# Patient Record
Sex: Female | Born: 1960 | Race: White | Hispanic: No | Smoking: Current every day smoker
Health system: Southern US, Community
[De-identification: ages and names within clinical notes are randomized; demographics above are authoritative.]

## PROBLEM LIST (undated history)

## (undated) DIAGNOSIS — E2740 Unspecified adrenocortical insufficiency: Secondary | ICD-10-CM

## (undated) DIAGNOSIS — F4000 Agoraphobia, unspecified: Secondary | ICD-10-CM

## (undated) DIAGNOSIS — Z720 Tobacco use: Secondary | ICD-10-CM

## (undated) DIAGNOSIS — F32A Depression, unspecified: Secondary | ICD-10-CM

## (undated) DIAGNOSIS — F329 Major depressive disorder, single episode, unspecified: Secondary | ICD-10-CM

## (undated) DIAGNOSIS — G4733 Obstructive sleep apnea (adult) (pediatric): Secondary | ICD-10-CM

## (undated) DIAGNOSIS — G8929 Other chronic pain: Secondary | ICD-10-CM

## (undated) HISTORY — DX: Other chronic pain: G89.29

---

## 2018-01-08 DIAGNOSIS — I517 Cardiomegaly: Secondary | ICD-10-CM | POA: Diagnosis not present

## 2018-01-08 DIAGNOSIS — I509 Heart failure, unspecified: Secondary | ICD-10-CM | POA: Diagnosis not present

## 2018-01-08 DIAGNOSIS — E8770 Fluid overload, unspecified: Secondary | ICD-10-CM | POA: Insufficient documentation

## 2018-01-08 DIAGNOSIS — R531 Weakness: Secondary | ICD-10-CM | POA: Diagnosis not present

## 2018-01-08 DIAGNOSIS — R0602 Shortness of breath: Secondary | ICD-10-CM | POA: Diagnosis not present

## 2018-01-09 ENCOUNTER — Inpatient Hospital Stay (HOSPITAL_COMMUNITY): Payer: Medicaid Other

## 2018-01-09 ENCOUNTER — Inpatient Hospital Stay (HOSPITAL_COMMUNITY)
Admission: AD | Admit: 2018-01-09 | Discharge: 2018-01-17 | DRG: 208 | Disposition: A | Discharge status: Home Health | Payer: Medicaid Other | Source: Other Acute Inpatient Hospital | Attending: Family Medicine | Admitting: Family Medicine

## 2018-01-09 ENCOUNTER — Encounter (HOSPITAL_COMMUNITY): Payer: Self-pay | Admitting: Critical Care Medicine

## 2018-01-09 DIAGNOSIS — J811 Chronic pulmonary edema: Secondary | ICD-10-CM | POA: Diagnosis not present

## 2018-01-09 DIAGNOSIS — K761 Chronic passive congestion of liver: Secondary | ICD-10-CM | POA: Diagnosis present

## 2018-01-09 DIAGNOSIS — F4000 Agoraphobia, unspecified: Secondary | ICD-10-CM | POA: Diagnosis present

## 2018-01-09 DIAGNOSIS — F329 Major depressive disorder, single episode, unspecified: Secondary | ICD-10-CM | POA: Diagnosis present

## 2018-01-09 DIAGNOSIS — J449 Chronic obstructive pulmonary disease, unspecified: Secondary | ICD-10-CM | POA: Diagnosis present

## 2018-01-09 DIAGNOSIS — R0603 Acute respiratory distress: Secondary | ICD-10-CM | POA: Diagnosis not present

## 2018-01-09 DIAGNOSIS — Z6841 Body Mass Index (BMI) 40.0 and over, adult: Secondary | ICD-10-CM | POA: Diagnosis not present

## 2018-01-09 DIAGNOSIS — K7460 Unspecified cirrhosis of liver: Secondary | ICD-10-CM

## 2018-01-09 DIAGNOSIS — R4182 Altered mental status, unspecified: Secondary | ICD-10-CM | POA: Diagnosis not present

## 2018-01-09 DIAGNOSIS — E662 Morbid (severe) obesity with alveolar hypoventilation: Secondary | ICD-10-CM | POA: Diagnosis present

## 2018-01-09 DIAGNOSIS — J810 Acute pulmonary edema: Secondary | ICD-10-CM

## 2018-01-09 DIAGNOSIS — D751 Secondary polycythemia: Secondary | ICD-10-CM | POA: Diagnosis present

## 2018-01-09 DIAGNOSIS — J90 Pleural effusion, not elsewhere classified: Secondary | ICD-10-CM | POA: Diagnosis not present

## 2018-01-09 DIAGNOSIS — J9811 Atelectasis: Secondary | ICD-10-CM | POA: Diagnosis present

## 2018-01-09 DIAGNOSIS — J9602 Acute respiratory failure with hypercapnia: Secondary | ICD-10-CM

## 2018-01-09 DIAGNOSIS — E871 Hypo-osmolality and hyponatremia: Secondary | ICD-10-CM | POA: Diagnosis not present

## 2018-01-09 DIAGNOSIS — G4733 Obstructive sleep apnea (adult) (pediatric): Secondary | ICD-10-CM | POA: Diagnosis not present

## 2018-01-09 DIAGNOSIS — D6959 Other secondary thrombocytopenia: Secondary | ICD-10-CM | POA: Diagnosis present

## 2018-01-09 DIAGNOSIS — I5031 Acute diastolic (congestive) heart failure: Secondary | ICD-10-CM | POA: Diagnosis present

## 2018-01-09 DIAGNOSIS — E876 Hypokalemia: Secondary | ICD-10-CM

## 2018-01-09 DIAGNOSIS — R918 Other nonspecific abnormal finding of lung field: Secondary | ICD-10-CM | POA: Diagnosis not present

## 2018-01-09 DIAGNOSIS — G9341 Metabolic encephalopathy: Secondary | ICD-10-CM | POA: Diagnosis present

## 2018-01-09 DIAGNOSIS — E2740 Unspecified adrenocortical insufficiency: Secondary | ICD-10-CM | POA: Diagnosis present

## 2018-01-09 DIAGNOSIS — J9621 Acute and chronic respiratory failure with hypoxia: Secondary | ICD-10-CM | POA: Diagnosis present

## 2018-01-09 DIAGNOSIS — K760 Fatty (change of) liver, not elsewhere classified: Secondary | ICD-10-CM | POA: Diagnosis not present

## 2018-01-09 DIAGNOSIS — J9601 Acute respiratory failure with hypoxia: Secondary | ICD-10-CM | POA: Diagnosis not present

## 2018-01-09 DIAGNOSIS — D72829 Elevated white blood cell count, unspecified: Secondary | ICD-10-CM | POA: Diagnosis present

## 2018-01-09 DIAGNOSIS — Z978 Presence of other specified devices: Secondary | ICD-10-CM | POA: Diagnosis not present

## 2018-01-09 DIAGNOSIS — J9622 Acute and chronic respiratory failure with hypercapnia: Secondary | ICD-10-CM | POA: Diagnosis present

## 2018-01-09 DIAGNOSIS — Z4659 Encounter for fitting and adjustment of other gastrointestinal appliance and device: Secondary | ICD-10-CM | POA: Diagnosis not present

## 2018-01-09 DIAGNOSIS — D696 Thrombocytopenia, unspecified: Secondary | ICD-10-CM | POA: Diagnosis not present

## 2018-01-09 DIAGNOSIS — R0902 Hypoxemia: Secondary | ICD-10-CM | POA: Diagnosis not present

## 2018-01-09 DIAGNOSIS — J9612 Chronic respiratory failure with hypercapnia: Secondary | ICD-10-CM | POA: Diagnosis not present

## 2018-01-09 DIAGNOSIS — G9340 Encephalopathy, unspecified: Secondary | ICD-10-CM | POA: Insufficient documentation

## 2018-01-09 DIAGNOSIS — F17210 Nicotine dependence, cigarettes, uncomplicated: Secondary | ICD-10-CM | POA: Diagnosis present

## 2018-01-09 DIAGNOSIS — Z4682 Encounter for fitting and adjustment of non-vascular catheter: Secondary | ICD-10-CM | POA: Diagnosis not present

## 2018-01-09 HISTORY — DX: Morbid (severe) obesity due to excess calories: E66.01

## 2018-01-09 HISTORY — DX: Unspecified adrenocortical insufficiency: E27.40

## 2018-01-09 HISTORY — DX: Tobacco use: Z72.0

## 2018-01-09 HISTORY — DX: Obstructive sleep apnea (adult) (pediatric): G47.33

## 2018-01-09 HISTORY — DX: Agoraphobia, unspecified: F40.00

## 2018-01-09 HISTORY — DX: Depression, unspecified: F32.A

## 2018-01-09 HISTORY — DX: Major depressive disorder, single episode, unspecified: F32.9

## 2018-01-09 LAB — CBC WITH DIFFERENTIAL/PLATELET
Basophils Absolute: 0 10*3/uL (ref 0.0–0.1)
Basophils Relative: 0 %
EOS PCT: 0 %
Eosinophils Absolute: 0 10*3/uL (ref 0.0–0.7)
HCT: 46.4 % — ABNORMAL HIGH (ref 36.0–46.0)
HEMOGLOBIN: 15.5 g/dL — AB (ref 12.0–15.0)
LYMPHS ABS: 1.5 10*3/uL (ref 0.7–4.0)
Lymphocytes Relative: 12 %
MCH: 28.8 pg (ref 26.0–34.0)
MCHC: 33.4 g/dL (ref 30.0–36.0)
MCV: 86.1 fL (ref 78.0–100.0)
MONO ABS: 0.8 10*3/uL (ref 0.1–1.0)
MONOS PCT: 6 %
NEUTROS PCT: 82 %
Neutro Abs: 10.5 10*3/uL — ABNORMAL HIGH (ref 1.7–7.7)
Platelets: 145 10*3/uL — ABNORMAL LOW (ref 150–400)
RBC: 5.39 MIL/uL — ABNORMAL HIGH (ref 3.87–5.11)
RDW: 15.2 % (ref 11.5–15.5)
WBC: 12.8 10*3/uL — ABNORMAL HIGH (ref 4.0–10.5)

## 2018-01-09 LAB — COMPREHENSIVE METABOLIC PANEL
ALT: 20 U/L (ref 14–54)
ANION GAP: 10 (ref 5–15)
AST: 27 U/L (ref 15–41)
Albumin: 3.3 g/dL — ABNORMAL LOW (ref 3.5–5.0)
Alkaline Phosphatase: 60 U/L (ref 38–126)
BILIRUBIN TOTAL: 1.7 mg/dL — AB (ref 0.3–1.2)
BUN: <5 mg/dL — ABNORMAL LOW (ref 6–20)
CHLORIDE: 79 mmol/L — AB (ref 101–111)
CO2: 34 mmol/L — ABNORMAL HIGH (ref 22–32)
Calcium: 7.6 mg/dL — ABNORMAL LOW (ref 8.9–10.3)
Creatinine, Ser: 0.58 mg/dL (ref 0.44–1.00)
GFR calc Af Amer: >60 mL/min (ref >60)
Glucose, Bld: 104 mg/dL — ABNORMAL HIGH (ref 65–99)
POTASSIUM: 2.9 mmol/L — AB (ref 3.5–5.1)
Sodium: 123 mmol/L — ABNORMAL LOW (ref 135–145)
TOTAL PROTEIN: 5.6 g/dL — AB (ref 6.5–8.1)

## 2018-01-09 LAB — TYPE AND SCREEN
ABO/RH(D): B POS
Antibody Screen: NEGATIVE

## 2018-01-09 LAB — PHOSPHORUS: PHOSPHORUS: 3.9 mg/dL (ref 2.5–4.6)

## 2018-01-09 LAB — PROTIME-INR
INR: 1.29
Prothrombin Time: 15.9 seconds — ABNORMAL HIGH (ref 11.4–15.2)

## 2018-01-09 LAB — MRSA PCR SCREENING: MRSA by PCR: NEGATIVE

## 2018-01-09 LAB — MAGNESIUM: MAGNESIUM: 2.1 mg/dL (ref 1.7–2.4)

## 2018-01-09 LAB — BRAIN NATRIURETIC PEPTIDE: B Natriuretic Peptide: 100.7 pg/mL — ABNORMAL HIGH (ref 0.0–100.0)

## 2018-01-09 LAB — LACTIC ACID, PLASMA: LACTIC ACID, VENOUS: 0.8 mmol/L (ref 0.5–1.9)

## 2018-01-09 LAB — ABO/RH: ABO/RH(D): B POS

## 2018-01-09 LAB — GLUCOSE, CAPILLARY
GLUCOSE-CAPILLARY: 113 mg/dL — AB (ref 65–99)
Glucose-Capillary: 145 mg/dL — ABNORMAL HIGH (ref 65–99)

## 2018-01-09 MED ORDER — LORAZEPAM 2 MG/ML IJ SOLN
5.00 | INTRAMUSCULAR | Status: DC
Start: ? — End: 2018-01-09

## 2018-01-09 MED ORDER — ENOXAPARIN SODIUM 40 MG/0.4ML ~~LOC~~ SOLN
40.0000 mg | SUBCUTANEOUS | Status: DC
  Administered 2018-01-09 – 2018-01-10 (×2): 40 mg via SUBCUTANEOUS
  Filled 2018-01-09 (×2): qty 0.4

## 2018-01-09 MED ORDER — ASPIRIN 81 MG PO CHEW: 324.0000 mg | CHEWABLE_TABLET | ORAL | Status: DC

## 2018-01-09 MED ORDER — FENTANYL CITRATE (PF) 100 MCG/2ML IJ SOLN
50.0000 ug | Freq: Once | INTRAMUSCULAR | Status: AC
  Administered 2018-01-09: 50 ug via INTRAVENOUS

## 2018-01-09 MED ORDER — GENERIC EXTERNAL MEDICATION
Status: DC
Start: ? — End: 2018-01-09

## 2018-01-09 MED ORDER — ALBUTEROL SULFATE (2.5 MG/3ML) 0.083% IN NEBU
2.50 | INHALATION_SOLUTION | RESPIRATORY_TRACT | Status: DC
Start: ? — End: 2018-01-09

## 2018-01-09 MED ORDER — PROPOFOL 1000 MG/100ML IV EMUL
INTRAVENOUS | Status: AC
  Administered 2018-01-09: 21:00:00
  Filled 2018-01-09: qty 100

## 2018-01-09 MED ORDER — POTASSIUM CHLORIDE 20 MEQ/15ML (10%) PO SOLN
ORAL | Status: DC
Start: ? — End: 2018-01-09

## 2018-01-09 MED ORDER — LABETALOL HCL 5 MG/ML IV SOLN
5.00 | INTRAVENOUS | Status: DC
Start: ? — End: 2018-01-09

## 2018-01-09 MED ORDER — DEXTROSE 5 % IV SOLN
INTRAVENOUS | Status: DC
Start: ? — End: 2018-01-09

## 2018-01-09 MED ORDER — ORAL CARE MOUTH RINSE
15.0000 mL | OROMUCOSAL | Status: DC
  Administered 2018-01-10 (×6): 15 mL via OROMUCOSAL

## 2018-01-09 MED ORDER — MIDAZOLAM HCL 2 MG/2ML IJ SOLN: 2.0000 mg | INTRAMUSCULAR | Status: DC | PRN

## 2018-01-09 MED ORDER — BENZONATATE 100 MG PO CAPS
100.00 | ORAL_CAPSULE | ORAL | Status: DC
Start: ? — End: 2018-01-09

## 2018-01-09 MED ORDER — SODIUM CHLORIDE 0.9 % IV SOLN
INTRAVENOUS | Status: DC
Start: ? — End: 2018-01-09

## 2018-01-09 MED ORDER — NICOTINE 14 MG/24HR TD PT24
MEDICATED_PATCH | TRANSDERMAL | Status: DC
Start: 2018-01-10 — End: 2018-01-09

## 2018-01-09 MED ORDER — LEVOFLOXACIN IN D5W 750 MG/150ML IV SOLN
750.00 | INTRAVENOUS | Status: DC
Start: 2018-01-10 — End: 2018-01-09

## 2018-01-09 MED ORDER — DOCUSATE SODIUM 50 MG/5ML PO LIQD: 100.0000 mg | Freq: Two times a day (BID) | ORAL | Status: DC | PRN

## 2018-01-09 MED ORDER — NITROGLYCERIN 0.4 MG SL SUBL
0.40 | SUBLINGUAL_TABLET | SUBLINGUAL | Status: DC
Start: ? — End: 2018-01-09

## 2018-01-09 MED ORDER — PANTOPRAZOLE SODIUM 40 MG IV SOLR
40.0000 mg | Freq: Every day | INTRAVENOUS | Status: DC
  Administered 2018-01-09: 40 mg via INTRAVENOUS

## 2018-01-09 MED ORDER — FENTANYL BOLUS VIA INFUSION
50.0000 ug | INTRAVENOUS | Status: DC | PRN
  Filled 2018-01-09: qty 50

## 2018-01-09 MED ORDER — POTASSIUM CHLORIDE CRYS ER 20 MEQ PO TBCR
EXTENDED_RELEASE_TABLET | ORAL | Status: DC
Start: ? — End: 2018-01-09

## 2018-01-09 MED ORDER — FENTANYL 2500MCG IN NS 250ML (10MCG/ML) PREMIX INFUSION
25.0000 ug/h | INTRAVENOUS | Status: DC
  Administered 2018-01-09: 50 ug/h via INTRAVENOUS
  Filled 2018-01-09: qty 250

## 2018-01-09 MED ORDER — ASPIRIN 300 MG RE SUPP: 300.0000 mg | RECTAL | Status: DC

## 2018-01-09 MED ORDER — CHLORHEXIDINE GLUCONATE 0.12% ORAL RINSE (MEDLINE KIT)
15.0000 mL | Freq: Two times a day (BID) | OROMUCOSAL | Status: DC
  Administered 2018-01-09 – 2018-01-10 (×2): 15 mL via OROMUCOSAL

## 2018-01-09 MED ORDER — PROPOFOL 200 MG/20ML IV EMUL
INTRAVENOUS | Status: DC
Start: ? — End: 2018-01-09

## 2018-01-09 MED ORDER — ANTACID & ANTIGAS 200-200-20 MG/5ML PO SUSP
30.00 | ORAL | Status: DC
Start: ? — End: 2018-01-09

## 2018-01-09 MED ORDER — SODIUM CHLORIDE 0.9 % IV SOLN
250.0000 mL | INTRAVENOUS | Status: DC | PRN
  Administered 2018-01-09: 250 mL via INTRAVENOUS

## 2018-01-09 MED ORDER — POTASSIUM CHLORIDE 20 MEQ/50ML IV SOLN
INTRAVENOUS | Status: DC
Start: ? — End: 2018-01-09

## 2018-01-09 MED ORDER — ENOXAPARIN SODIUM 40 MG/0.4ML ~~LOC~~ SOLN
40.00 | SUBCUTANEOUS | Status: DC
Start: 2018-01-10 — End: 2018-01-09

## 2018-01-09 MED ORDER — POLYETHYLENE GLYCOL 3350 17 G PO PACK
17.00 | PACK | ORAL | Status: DC
Start: ? — End: 2018-01-09

## 2018-01-09 NOTE — Progress Notes (Signed)
eLink Physician-Brief Progress Note Patient Name: Stephanie Baldwin DOB: [birth year] MRN: [id number]   Date of Service  01/09/2018  HPI/Events of Note  57 yo admitted for severe resp failure in setting of severe hyponatremia 105-->124 corrected in less than 24 hrs is what I am told PCCM to evaluate, patient arrived from [place]  eICU Interventions  1.admit to ICU 2.repeat labs 3.vent orders/support      Intervention Category Evaluation Type: New Patient Evaluation  Erin FullingKurian Brynlei Klausner 01/09/2018, 8:56 PM

## 2018-01-09 NOTE — H&P (Signed)
PULMONARY / CRITICAL CARE MEDICINE   Name: Stephanie Baldwin MRN: [id number] DOB: January 05, 1961    ADMISSION DATE:  01/09/2018 CONSULTATION DATE:  01/09/18  REFERRING MD:  Mohsin Arshad  CHIEF COMPLAINT:  Respiratory failure and severe hyponatremia  HISTORY OF PRESENT ILLNESS:   Stephanie Baldwin is a 57 y/o woman who presented to [facility] with lethargy, weakness, confusion, and shortness of breath on 4/9 where she was found to be edematous and hyponatremic to 105. Prior to this she was noted to have nausea, vomiting, and very little PO intake for 2 weeks leading up to this admission. She was placed on Bipap for respiratory distress and also started on hypertonic saline for symptomatic hyponatremia. Over the next day sodium corrected rapidly to 124 but she developed worsened mental status and hypercapnea and hypoxia on ABG so was intubated. Due to critical condition and patient's family concerns about the level of care available she was transferred to [facility] ICU as a tertiary care site. She arrived in a stable condition on the ventilator with her mother, sister, and niece present at bedside.  PAST MEDICAL HISTORY :  She  has a past medical history of Agoraphobia, Depression, and Tobacco abuse.  PAST SURGICAL HISTORY: C Section  Allergies  Allergen Reactions  . Aspirin   . Guaifenesin & Derivatives   . Paxil [Paroxetine]   . Penicillins   . Sulfa Antibiotics     No current facility-administered medications on file prior to encounter.    No current outpatient medications on file prior to encounter.    FAMILY HISTORY:  Her has no family status information on file.    SOCIAL HISTORY: Current smoker  REVIEW OF SYSTEMS:   Review of Systems  Constitutional: Negative for fever.  Eyes: Negative for blurred vision.  Respiratory: Positive for shortness of breath.   Cardiovascular: Positive for leg swelling. Negative for chest pain.  Gastrointestinal: Positive for  nausea and vomiting.  Genitourinary: Negative for dysuria.  Musculoskeletal: Positive for back pain.  Skin: Negative for rash.  Neurological: Positive for weakness.  Endo/Heme/Allergies: Negative for polydipsia.  Psychiatric/Behavioral: Positive for depression.     SUBJECTIVE:    VITAL SIGNS: BP 113/68 (BP Location: Left Arm)   Pulse (!) 102   Temp 98.5 F (36.9 C) (Oral)   Resp (!) 22   Ht 5\' 2"  (1.575 m) Comment: per family  Wt 253 lb 4.9 oz (114.9 kg)   SpO2 96%   BMI 46.33 kg/m   HEMODYNAMICS:    VENTILATOR SETTINGS: Vent Mode: PRVC FiO2 (%):  [70 %] 70 % Set Rate:  [12 bmp] 12 bmp Vt Set:  [330 mL] 330 mL PEEP:  [5 cmH20] 5 cmH20 Plateau Pressure:  [20 cmH20] 20 cmH20  INTAKE / OUTPUT: No intake/output data recorded.  PHYSICAL EXAMINATION: General:  Obese woman sedated on ventilator Neuro:  Arousable to touch, follows commands to move extremities after delay HEENT:  Bilateral conjunctivitis, pupils sluggish but reactive Cardiovascular:  Regular rate and rhythm, no obvious murmur although distant heart sound, 2+ DP pulses b/l Lungs:  Good air movement bilaterally, vent assisted breaths Abdomen:  Obese, soft, small lower abdominal wall edema, no fluid wave Musculoskeletal:  Poor muscle bulk in lower extremities without focal abnormality Skin:  No rashes, lesions, or varicosities  LABS:  BMET No results for input(s): NA, K, CL, CO2, BUN, CREATININE, GLUCOSE in the last 168 hours.  Electrolytes No results for input(s): CALCIUM, MG, PHOS in the last 168 hours.  CBC No results for input(s): WBC, HGB, HCT, PLT in the last 168 hours.  Coag's No results for input(s): APTT, INR in the last 168 hours.  Sepsis Markers No results for input(s): LATICACIDVEN, PROCALCITON, O2SATVEN in the last 168 hours.  ABG Recent Labs  Lab 01/09/18 2200  PHART 7.331*  PCO2ART 76.5*  PO2ART 102    Liver Enzymes No results for input(s): AST, ALT, ALKPHOS, BILITOT,  ALBUMIN in the last 168 hours.  Cardiac Enzymes No results for input(s): TROPONINI, PROBNP in the last 168 hours.  Glucose Recent Labs  Lab 01/09/18 2055  GLUCAP 145*    Imaging Dg Abd 1 View  Result Date: 01/09/2018 CLINICAL DATA:  Initial evaluation for enteric tube placement. EXAM: ABDOMEN - 1 VIEW COMPARISON:  None. FINDINGS: Enteric tube in place with tip in side hole overlying the gastric body, well beyond the GE junction. Tip projects superiorly towards the gastric fundus. Bowel gas pattern within normal limits without obstruction or ileus. No abnormal bowel wall thickening. No acute abnormality within the visualized abdomen. Degenerative changes noted within lower lumbar spine. IMPRESSION: Tip and side hole of enteric tube overlying the stomach, well beyond the GE junction. Electronically Signed   By: Rise MuBenjamin  McClintock M.D.   On: 01/09/2018 22:16   Dg Chest Port 1 View  Result Date: 01/09/2018 CLINICAL DATA:  Initial evaluation for endotracheal tube placement. EXAM: PORTABLE CHEST 1 VIEW COMPARISON:  None. FINDINGS: Patient is intubated with the tip of an endotracheal tube well positioned 5 cm above the carina. Enteric tube courses into the abdomen. Right PICC catheter in place with tip overlying the cavoatrial junction. Cardiomegaly.  Mediastinal silhouette normal. Lungs hypoinflated. Diffuse vascular congestion with interstitial prominence, suggesting pulmonary interstitial edema. Small bilateral pleural effusions, left greater than right. Associated bibasilar opacities likely reflect atelectasis and/or edema, although infiltrates could be considered in the correct clinical setting. No pneumothorax. No acute osseous abnormality. IMPRESSION: 1. Tip of endotracheal tube well positioned 5 cm above the carina. 2. Cardiomegaly with moderate diffuse pulmonary interstitial edema and left greater than right small bilateral pleural effusions. 3. Associated bibasilar opacities favored to  reflect atelectasis and/or edema. Infiltrates could be considered in the correct clinical setting. Electronically Signed   By: Rise MuBenjamin  McClintock M.D.   On: 01/09/2018 22:15     STUDIES:  4/9 OSH CT head/CTA chest - No ICH, no PE, small b/l effusions 4/10 DG Abdomen -  4/10 CG Chest -   CULTURES: 4/10 Blood Cx >>> 4/10 Urine Cx >>>  ANTIBIOTICS: None  SIGNIFICANT EVENTS: 4/10 Intubation at OSH, [facility]  LINES/TUBES: Foley 4/9 >>> RUE PICC 4/9 >>>  DISCUSSION: 57 y/o woman with poor medical follow up for years coming [facility] with symptomatic hyponatremia, edema, and respiratory distress after very low PO tolerance/intake. Sodium was rapidly corrected from 105 to 124 as a concern for the worsened respiratory status. Presentation suspicious for underlying heart failure with elevated BNP, edema, hypoxia.  ASSESSMENT / PLAN:  PULMONARY A: On PRVC with mixed hypercapneic/hypoxic respiratory failure at OSH May be 2/2 possible heart failure Also at risk for [facility] dysfunction/CPM with rapid osmotic changes P:   Full vent support, follow ABGs Fentanyl gtt PRN versed  CARDIOVASCULAR A:  Maintaining normotensive Presentation suspicious for underlying heart failure P:  Repeat BNP Lactic acid TTE  RENAL A:   OSH Cr low 0.46, repeat pending Appears to be mixed picture hyponatremia with large extravascular volume but ? Intravascular status  with low Na low intake Last outside Na 124 (from 105 on 4/9) P:   Hold all fluids or diuretics Bmets q2hrs Magnesium Phosphorus  GASTROINTESTINAL A:   Recent symptoms no focal findings P:   Protonix for SUP  HEMATOLOGIC A:   Baseline normal P:  CBC w/ Diff  INFECTIOUS A:   No leukocytosis or fever documented to date Outside lines foley, PICC Did have recent GI s/s PTA P:   Blood Cx x2 Check UA PCT  ENDOCRINE A:   Checking CMP CBG 145 P:   Cortisol level F/U  CMP  NEUROLOGIC A:   Arousable but delayed, was moving all extremities to commands on arrival She is at risk for ODS, symptoms can be significantly delayed P:   Repeat CT head w/o for evidence of edema RASS goal: -1    FAMILY  - Updates: 4/10 Updated patient's mother Britta Mccreedy at bedside with initial plan for labs, imaging, further workup.   - Inter-disciplinary family meet or Palliative Care meeting due by:  01/15/18  Fuller Plan, MD PGY-III Internal Medicine Resident Pager# [phone] 01/09/2018, 10:41 PM

## 2018-01-10 ENCOUNTER — Ambulatory Visit (HOSPITAL_COMMUNITY): Payer: Medicaid Other

## 2018-01-10 ENCOUNTER — Encounter (HOSPITAL_COMMUNITY): Payer: Self-pay

## 2018-01-10 ENCOUNTER — Inpatient Hospital Stay (HOSPITAL_COMMUNITY): Payer: Medicaid Other

## 2018-01-10 ENCOUNTER — Other Ambulatory Visit: Payer: Self-pay

## 2018-01-10 LAB — BASIC METABOLIC PANEL
ANION GAP: 10 (ref 5–15)
ANION GAP: 10 (ref 5–15)
ANION GAP: 10 (ref 5–15)
ANION GAP: 11 (ref 5–15)
ANION GAP: 10 (ref 5–15)
ANION GAP: 10 (ref 5–15)
ANION GAP: 9 (ref 5–15)
ANION GAP: 9 (ref 5–15)
Anion gap: 11 (ref 5–15)
Anion gap: 12 (ref 5–15)
Anion gap: 9 (ref 5–15)
BUN: <5 mg/dL — ABNORMAL LOW (ref 6–20)
BUN: <5 mg/dL — ABNORMAL LOW (ref 6–20)
BUN: <5 mg/dL — ABNORMAL LOW (ref 6–20)
BUN: <5 mg/dL — ABNORMAL LOW (ref 6–20)
BUN: <5 mg/dL — ABNORMAL LOW (ref 6–20)
BUN: <5 mg/dL — ABNORMAL LOW (ref 6–20)
BUN: <5 mg/dL — ABNORMAL LOW (ref 6–20)
BUN: <5 mg/dL — ABNORMAL LOW (ref 6–20)
BUN: <5 mg/dL — ABNORMAL LOW (ref 6–20)
BUN: <5 mg/dL — ABNORMAL LOW (ref 6–20)
CALCIUM: 7.7 mg/dL — AB (ref 8.9–10.3)
CALCIUM: 7.7 mg/dL — AB (ref 8.9–10.3)
CALCIUM: 7.8 mg/dL — AB (ref 8.9–10.3)
CALCIUM: 7.6 mg/dL — AB (ref 8.9–10.3)
CALCIUM: 7.9 mg/dL — AB (ref 8.9–10.3)
CALCIUM: 7.8 mg/dL — AB (ref 8.9–10.3)
CALCIUM: 7.5 mg/dL — AB (ref 8.9–10.3)
CALCIUM: 7.6 mg/dL — AB (ref 8.9–10.3)
CHLORIDE: 84 mmol/L — AB (ref 101–111)
CHLORIDE: 79 mmol/L — AB (ref 101–111)
CHLORIDE: 80 mmol/L — AB (ref 101–111)
CHLORIDE: 79 mmol/L — AB (ref 101–111)
CO2: 32 mmol/L (ref 22–32)
CO2: 32 mmol/L (ref 22–32)
CO2: 33 mmol/L — AB (ref 22–32)
CO2: 33 mmol/L — AB (ref 22–32)
CO2: 33 mmol/L — AB (ref 22–32)
CO2: 33 mmol/L — AB (ref 22–32)
CO2: 33 mmol/L — ABNORMAL HIGH (ref 22–32)
CO2: 33 mmol/L — ABNORMAL HIGH (ref 22–32)
CO2: 34 mmol/L — AB (ref 22–32)
CO2: 34 mmol/L — ABNORMAL HIGH (ref 22–32)
CO2: 35 mmol/L — AB (ref 22–32)
CREATININE: 0.5 mg/dL (ref 0.44–1.00)
CREATININE: 0.54 mg/dL (ref 0.44–1.00)
CREATININE: 0.53 mg/dL (ref 0.44–1.00)
CREATININE: 0.51 mg/dL (ref 0.44–1.00)
CREATININE: 0.53 mg/dL (ref 0.44–1.00)
Calcium: 7.4 mg/dL — ABNORMAL LOW (ref 8.9–10.3)
Calcium: 7.5 mg/dL — ABNORMAL LOW (ref 8.9–10.3)
Calcium: 7.5 mg/dL — ABNORMAL LOW (ref 8.9–10.3)
Chloride: 82 mmol/L — ABNORMAL LOW (ref 101–111)
Chloride: 82 mmol/L — ABNORMAL LOW (ref 101–111)
Chloride: 82 mmol/L — ABNORMAL LOW (ref 101–111)
Chloride: 84 mmol/L — ABNORMAL LOW (ref 101–111)
Chloride: 82 mmol/L — ABNORMAL LOW (ref 101–111)
Chloride: 82 mmol/L — ABNORMAL LOW (ref 101–111)
Chloride: 82 mmol/L — ABNORMAL LOW (ref 101–111)
Creatinine, Ser: 0.5 mg/dL (ref 0.44–1.00)
Creatinine, Ser: 0.51 mg/dL (ref 0.44–1.00)
Creatinine, Ser: 0.54 mg/dL (ref 0.44–1.00)
Creatinine, Ser: 0.55 mg/dL (ref 0.44–1.00)
Creatinine, Ser: 0.55 mg/dL (ref 0.44–1.00)
Creatinine, Ser: 0.59 mg/dL (ref 0.44–1.00)
GFR calc Af Amer: >60 mL/min (ref >60)
GFR calc non Af Amer: >60 mL/min (ref >60)
GFR calc non Af Amer: >60 mL/min (ref >60)
GFR calc non Af Amer: >60 mL/min (ref >60)
GFR calc non Af Amer: >60 mL/min (ref >60)
GFR calc non Af Amer: >60 mL/min (ref >60)
GFR calc non Af Amer: >60 mL/min (ref >60)
GLUCOSE: 92 mg/dL (ref 65–99)
GLUCOSE: 100 mg/dL — AB (ref 65–99)
GLUCOSE: 92 mg/dL (ref 65–99)
Glucose, Bld: 104 mg/dL — ABNORMAL HIGH (ref 65–99)
Glucose, Bld: 143 mg/dL — ABNORMAL HIGH (ref 65–99)
Glucose, Bld: 152 mg/dL — ABNORMAL HIGH (ref 65–99)
Glucose, Bld: 87 mg/dL (ref 65–99)
Glucose, Bld: 89 mg/dL (ref 65–99)
Glucose, Bld: 93 mg/dL (ref 65–99)
Glucose, Bld: 94 mg/dL (ref 65–99)
Glucose, Bld: 97 mg/dL (ref 65–99)
POTASSIUM: 2.9 mmol/L — AB (ref 3.5–5.1)
POTASSIUM: 3.4 mmol/L — AB (ref 3.5–5.1)
POTASSIUM: 3.5 mmol/L (ref 3.5–5.1)
Potassium: 4 mmol/L (ref 3.5–5.1)
Potassium: 5.4 mmol/L — ABNORMAL HIGH (ref 3.5–5.1)
Potassium: 4.1 mmol/L (ref 3.5–5.1)
Potassium: 3.8 mmol/L (ref 3.5–5.1)
Potassium: 3.6 mmol/L (ref 3.5–5.1)
Potassium: 3.4 mmol/L — ABNORMAL LOW (ref 3.5–5.1)
Potassium: 3.8 mmol/L (ref 3.5–5.1)
Potassium: 3.5 mmol/L (ref 3.5–5.1)
SODIUM: 123 mmol/L — AB (ref 135–145)
SODIUM: 123 mmol/L — AB (ref 135–145)
SODIUM: 124 mmol/L — AB (ref 135–145)
SODIUM: 125 mmol/L — AB (ref 135–145)
SODIUM: 125 mmol/L — AB (ref 135–145)
SODIUM: 126 mmol/L — AB (ref 135–145)
Sodium: 125 mmol/L — ABNORMAL LOW (ref 135–145)
Sodium: 126 mmol/L — ABNORMAL LOW (ref 135–145)
Sodium: 127 mmol/L — ABNORMAL LOW (ref 135–145)
Sodium: 127 mmol/L — ABNORMAL LOW (ref 135–145)
Sodium: 123 mmol/L — ABNORMAL LOW (ref 135–145)

## 2018-01-10 LAB — HEPATIC FUNCTION PANEL
ALT: 18 U/L (ref 14–54)
AST: 27 U/L (ref 15–41)
Albumin: 3.1 g/dL — ABNORMAL LOW (ref 3.5–5.0)
Alkaline Phosphatase: 63 U/L (ref 38–126)
Bilirubin, Direct: 0.5 mg/dL (ref 0.1–0.5)
Indirect Bilirubin: 0.9 mg/dL (ref 0.3–0.9)
TOTAL PROTEIN: 5.5 g/dL — AB (ref 6.5–8.1)
Total Bilirubin: 1.4 mg/dL — ABNORMAL HIGH (ref 0.3–1.2)

## 2018-01-10 LAB — URINALYSIS, ROUTINE W REFLEX MICROSCOPIC
Bilirubin Urine: NEGATIVE
GLUCOSE, UA: NEGATIVE mg/dL
KETONES UR: NEGATIVE mg/dL
LEUKOCYTES UA: NEGATIVE
Nitrite: NEGATIVE
PROTEIN: NEGATIVE mg/dL
Specific Gravity, Urine: 1.003 — ABNORMAL LOW (ref 1.005–1.030)
pH: 7 (ref 5.0–8.0)

## 2018-01-10 LAB — BLOOD GAS, ARTERIAL
ACID-BASE EXCESS: 13 mmol/L — AB (ref 0.0–2.0)
Bicarbonate: 39.3 mmol/L — ABNORMAL HIGH (ref 20.0–28.0)
Drawn by: 41422
FIO2: 70
MECHVT: 330 mL
O2 Saturation: 97 %
PEEP: 5 cmH2O
Patient temperature: 98.6
RATE: 12 resp/min
pCO2 arterial: 76.5 mmHg (ref 32.0–48.0)
pH, Arterial: 7.331 — ABNORMAL LOW (ref 7.350–7.450)
pO2, Arterial: 102 mmHg (ref 83.0–108.0)

## 2018-01-10 LAB — GLUCOSE, CAPILLARY
GLUCOSE-CAPILLARY: 94 mg/dL (ref 65–99)
GLUCOSE-CAPILLARY: 90 mg/dL (ref 65–99)
Glucose-Capillary: 90 mg/dL (ref 65–99)

## 2018-01-10 LAB — SODIUM, URINE, RANDOM: Sodium, Ur: <10 mmol/L

## 2018-01-10 LAB — CORTISOL: Cortisol, Plasma: 1.9 ug/dL

## 2018-01-10 LAB — HIV ANTIBODY (ROUTINE TESTING W REFLEX): HIV Screen 4th Generation wRfx: NONREACTIVE

## 2018-01-10 LAB — ECHOCARDIOGRAM COMPLETE
HEIGHTINCHES: 62 in
WEIGHTICAEL: 4014.14 [oz_av]

## 2018-01-10 LAB — TSH: TSH: 2.224 u[IU]/mL (ref 0.350–4.500)

## 2018-01-10 LAB — OSMOLALITY, URINE: Osmolality, Ur: 64 mOsm/kg — ABNORMAL LOW (ref 300–900)

## 2018-01-10 LAB — OSMOLALITY
Osmolality: 262 mOsm/kg — ABNORMAL LOW (ref 275–295)
Osmolality: 258 mOsm/kg — ABNORMAL LOW (ref 275–295)

## 2018-01-10 LAB — PROCALCITONIN

## 2018-01-10 MED ORDER — POTASSIUM CHLORIDE 10 MEQ/100ML IV SOLN
10.0000 meq | INTRAVENOUS | Status: AC
  Administered 2018-01-10 (×2): 10 meq via INTRAVENOUS
  Filled 2018-01-10 (×2): qty 100

## 2018-01-10 MED ORDER — ORAL CARE MOUTH RINSE
15.0000 mL | Freq: Two times a day (BID) | OROMUCOSAL | Status: DC
  Administered 2018-01-11 – 2018-01-16 (×10): 15 mL via OROMUCOSAL

## 2018-01-10 MED ORDER — DEXTROSE-NACL 5-0.45 % IV SOLN
INTRAVENOUS | Status: DC
  Administered 2018-01-10: 10:00:00 via INTRAVENOUS

## 2018-01-10 MED ORDER — GENERIC EXTERNAL MEDICATION
Status: DC
Start: ? — End: 2018-01-10

## 2018-01-10 MED ORDER — DEXTROSE 5 % IV SOLN
INTRAVENOUS | Status: DC
  Administered 2018-01-10 – 2018-01-11 (×3): via INTRAVENOUS

## 2018-01-10 MED ORDER — POTASSIUM CHLORIDE 20 MEQ/15ML (10%) PO SOLN
40.0000 meq | Freq: Once | ORAL | Status: AC
  Administered 2018-01-10: 40 meq via ORAL
  Filled 2018-01-10: qty 30

## 2018-01-10 MED ORDER — ACETAMINOPHEN 325 MG PO TABS
650.0000 mg | ORAL_TABLET | ORAL | Status: DC | PRN
  Administered 2018-01-10 – 2018-01-11 (×2): 650 mg via ORAL
  Filled 2018-01-10 (×2): qty 2

## 2018-01-10 MED ORDER — DEXTROSE 5 % IV SOLN
INTRAVENOUS | Status: DC
  Administered 2018-01-10: 11:00:00 via INTRAVENOUS

## 2018-01-10 MED ORDER — NICOTINE 21 MG/24HR TD PT24
21.0000 mg | MEDICATED_PATCH | Freq: Every day | TRANSDERMAL | Status: DC
  Administered 2018-01-10 – 2018-01-17 (×8): 21 mg via TRANSDERMAL
  Filled 2018-01-10 (×8): qty 1

## 2018-01-10 MED ORDER — SODIUM CHLORIDE 0.9 % IV SOLN
2.0000 ug | Freq: Four times a day (QID) | INTRAVENOUS | Status: DC
  Administered 2018-01-10 – 2018-01-11 (×4): 2 ug via INTRAVENOUS
  Filled 2018-01-10 (×5): qty 0.5

## 2018-01-10 NOTE — Progress Notes (Signed)
Lab was called at 1330 about 12:00 BMP not showing up in Epic, spoke with Selena BattenKim.  I sent the Hepatic panel at the same time of the BMP, neither were showing up in Epic. She stated she was going to run the BMP and the hepatic function had already been run.  I asked for reassurance multiple times that the BMP was for sure being run right now and she said yes.     Montine CircleKim Rajan Burgard, RN

## 2018-01-10 NOTE — Progress Notes (Signed)
  Echocardiogram 2D Echocardiogram has been performed.  Stephanie Baldwin, Stephanie Baldwin F 01/10/2018, 10:39 AM

## 2018-01-10 NOTE — Progress Notes (Signed)
Dr. Vassie LollAlva was called to update sodium level of 125 for 12:00, and 126 at 1450 per request.  No new orders were given at this time.    Montine CircleKim Kenyetta Wimbish, RN

## 2018-01-10 NOTE — Progress Notes (Signed)
Called lab regarding pt's BNP results that are not showing up in Epic for 12:00 and 14:00. Kim RN drew labs at the appropriate time and sent them in tube station. Lab tech says they will run it now and send up results on a note through tube station. Paper never arrived. Still awaiting results in Epic.

## 2018-01-10 NOTE — Progress Notes (Signed)
Wasted 175 cc of Fentanyl down sink with Nelva NayMary Beth Johnson, RN    Montine CircleKim Alleya Demeter, RN

## 2018-01-10 NOTE — Plan of Care (Signed)
  Problem: Activity: Goal: Ability to tolerate increased activity will improve Outcome: Progressing Note:  Repositioned in bed q2 hrs, bed set to rotate position.   Problem: Respiratory: Goal: Ability to maintain a clear airway and adequate ventilation will improve Outcome: Progressing Note:  Suctioned PRN   Problem: Role Relationship: Goal: Method of communication will improve Outcome: Progressing Note:  Able to communicate simple requests

## 2018-01-10 NOTE — Progress Notes (Addendum)
Name: Stephanie Baldwin MRN:   [id number] DOB:   [birth year]         ADMISSION DATE:  01/09/2018 CONSULTATION DATE:  01/09/18  REFERRING MD:  Laqueta JeanMohsin Arshad  CHIEF COMPLAINT:  Respiratory failure and severe hyponatremia  HISTORY OF PRESENT ILLNESS:   Stephanie Baldwin is a 57 y/o woman who presented to [facility] with lethargy, weakness, confusion, and shortness of breath on 4/9 where she was found to be edematous and hyponatremic to 105. Prior to this she was noted to have nausea, vomiting, and very little PO intake for 2 weeks leading up to this admission. She was placed on Bipap for respiratory distress and also started on hypertonic saline for symptomatic hyponatremia. Over the next day sodium corrected rapidly to 124 but she developed worsened mental status and hypercapnea and hypoxia on ABG so was intubated. Due to critical condition and patient's family concerns about the level of care available she was [facility] as a tertiary care site. She arrived in a stable condition on the ventilator with her mother, sister, and niece present at bedside.  Blood Pressure (Abnormal) 97/55   Pulse 81   Temperature 97.9 F (36.6 C) (Oral)   Respiration 13   Height 5\' 2"  (1.575 m) Comment: per family  Weight 250 lb 14.1 oz (113.8 kg)   Oxygen Saturation 91%   Body Mass Index 45.89 kg/m    Vent Mode: PRVC FiO2 (%):  [40 %-70 %] 50 % Set Rate:  [12 bmp] 12 bmp Vt Set:  [330 mL-440 mL] 400 mL PEEP:  [5 cmH20] 5 cmH20 Plateau Pressure:  [13 cmH20-24 cmH20] 18 cmH20   Intake/Output Summary (Last 24 hours) at 01/10/2018 0957 Last data filed at 01/10/2018 0900 Gross per 24 hour  Intake 157.67 ml  Output 1345 ml  Net -1187.33 ml    Physical exam General 57 year old white female. Resting in bed. Looks good on SBT HENT: neck large. MMM NCAT Pulm: decreased bases. Some scattered rhonchi Card: rrr no MRG abd soft not tender + bowel sounds Ext/MS: equal st and bulk  warm some LE dependent edema Neuro: awake, oriented. No focal def. Sp raspy  CBC Recent Labs    01/09/18 2250  WBC 12.8*  HGB 15.5*  HCT 46.4*  PLT 145*    Coag's Recent Labs    01/09/18 2250  INR 1.29    BMET Recent Labs    01/10/18 0427 01/10/18 0608 01/10/18 0825  NA 125* 125* 126*  K 5.4* 4.1 3.8  CL 82* 82* 84*  CO2 33* 33* 32  BUN <5* <5* <5*  CREATININE 0.54 0.53 0.51  GLUCOSE 87 93 92    Electrolytes Recent Labs    01/09/18 2250  01/10/18 0427 01/10/18 0608 01/10/18 0825  CALCIUM 7.6*   < > 7.7* 7.7* 7.8*  MG 2.1  --   --   --   --   PHOS 3.9  --   --   --   --    < > = values in this interval not displayed.    Sepsis Markers Recent Labs    01/09/18 2250  PROCALCITON <0.10    ABG Recent Labs    01/09/18 2200  PHART 7.331*  PCO2ART 76.5*  PO2ART 102    Liver Enzymes Recent Labs    01/09/18 2250  AST 27  ALT 20  ALKPHOS 60  BILITOT 1.7*  ALBUMIN 3.3*    Cardiac Enzymes No results  for input(s): TROPONINI, PROBNP in the last 72 hours.  Glucose Recent Labs    01/09/18 2055 01/09/18 2326 01/10/18 0334 01/10/18 0734  GLUCAP 145* 113* 94 90    Imaging Dg Abd 1 View  Result Date: 01/09/2018 CLINICAL DATA:  Initial evaluation for enteric tube placement. EXAM: ABDOMEN - 1 VIEW COMPARISON:  None. FINDINGS: Enteric tube in place with tip in side hole overlying the gastric body, well beyond the GE junction. Tip projects superiorly towards the gastric fundus. Bowel gas pattern within normal limits without obstruction or ileus. No abnormal bowel wall thickening. No acute abnormality within the visualized abdomen. Degenerative changes noted within lower lumbar spine. IMPRESSION: Tip and side hole of enteric tube overlying the stomach, well beyond the GE junction. Electronically Signed   By: Rise Mu M.D.   On: 01/09/2018 22:16   Dg Chest Port 1 View  Result Date: 01/09/2018 CLINICAL DATA:  Initial evaluation for  endotracheal tube placement. EXAM: PORTABLE CHEST 1 VIEW COMPARISON:  None. FINDINGS: Patient is intubated with the tip of an endotracheal tube well positioned 5 cm above the carina. Enteric tube courses into the abdomen. Right PICC catheter in place with tip overlying the cavoatrial junction. Cardiomegaly.  Mediastinal silhouette normal. Lungs hypoinflated. Diffuse vascular congestion with interstitial prominence, suggesting pulmonary interstitial edema. Small bilateral pleural effusions, left greater than right. Associated bibasilar opacities likely reflect atelectasis and/or edema, although infiltrates could be considered in the correct clinical setting. No pneumothorax. No acute osseous abnormality. IMPRESSION: 1. Tip of endotracheal tube well positioned 5 cm above the carina. 2. Cardiomegaly with moderate diffuse pulmonary interstitial edema and left greater than right small bilateral pleural effusions. 3. Associated bibasilar opacities favored to reflect atelectasis and/or edema. Infiltrates could be considered in the correct clinical setting. Electronically Signed   By: Rise Mu M.D.   On: 01/09/2018 22:15   Impression/plan  Acute metabolic encephalopathy -suspect was d/t symptomatic hyponatremia then complicated by sedation -she is awake and non-focal. Seems appropriate.  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 This can be delayed onset so still need to address water balance correction rate.   Plan Dc IV sedation PAD goal 0 Continue supportive care Seizure precautions  Hypervolemic hypoosmolar Hyponatremia (symptomatic) ? Liver disease vs heart failure ?   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  -course c/b rapid correction (105 to 124 in less than 24hrs)  -Na cont to rise some Plan Desmopressin IV q 6 hrs (this will continue to prevent free water loss) D5W 63ml/kg over 2 hrs; repeat this until her Na is < 121; then stop/NSL Cont serial bmp every 2  hrs Strict I&O Check TSH & LFTs After that goal will be to increase Na by no more than 8 in an 24 hour period Get abd [place]  Acute hypercarbic and hypoxic respiratory failure  ? Volume overload PCXR w/ ett good position as well as PICC line. Bilateral airspace disease c/w pulmonary edema -passed SBT Plan Extubate to [place] BIPAP at HS  Eventually diuresis BUT currently need to get water balance back on track Pulse ox goal > 92% Repeat cxr in am   Cardiomegaly r/o heart failure -bnp not very impressive Plan Cont tele  F/u echo  Probable adrenal insuff  Plan Monitor    Thrombocytopenia Plan Trend cbc [place] LMWH  Mild leukocytosis Plan F/u culture data  Simonne Martinet ACNP-BC [facility] Pulmonary/Critical Care Pager # [phone] OR # [phone] if no answer

## 2018-01-10 NOTE — Care Management Note (Addendum)
Case Management Note  Patient Details  Name: Stephanie Baldwin MRN: [id number] Date of Birth: 02-16-1961  Subjective/Objective:  History of Agoraphobia, Depression, and Tobacco abuse; admitted for Respiratory failure and severe hyponatremia;[facility] as a tertiary care [facility]. Prior to admission patient lived at home.  Action/Plan: 4/11 extubated today.  Seems to have good mental status alert and awake and nonfocal.  Concern now is  for rapid correction of sodium, we will use D5W and a.m. for serum sodium between 120-125 range for the next 24 hours and then can correct rapidly after tomorrow NCM will continue to follow for any discharge transition needs.   Expected Discharge Date:     To be determined             Expected Discharge Plan:  Home/Self Care  Discharge planning Services  CM Consult  Status of Service:  In process, will continue to follow  If discussed at Long Length of Stay Meetings, dates discussed:    Additional Comments:  Yancey FlemingsKimberly R Nickolaus Bordelon, RN  Nurse case manager [place] 01/10/2018, 3:30 PM

## 2018-01-10 NOTE — Progress Notes (Signed)
Pt extubated to 6 lpm [place] with Spo2 at 86-89%, increased O2 to High Flow Nasal Cannula at 12 LPM, Spo2 at 93%. HR 87 RR 20. No stridor. Breath sounds equal and bilat. Pt vocalized name and is oriented to time and place.No distress noted. HOB>30 degrees.

## 2018-01-11 ENCOUNTER — Inpatient Hospital Stay (HOSPITAL_COMMUNITY): Payer: Medicaid Other

## 2018-01-11 LAB — BASIC METABOLIC PANEL
ANION GAP: 9 (ref 5–15)
ANION GAP: 10 (ref 5–15)
Anion gap: 8 (ref 5–15)
Anion gap: 9 (ref 5–15)
Anion gap: 9 (ref 5–15)
Anion gap: 8 (ref 5–15)
CALCIUM: 7.6 mg/dL — AB (ref 8.9–10.3)
CALCIUM: 7.7 mg/dL — AB (ref 8.9–10.3)
CHLORIDE: 76 mmol/L — AB (ref 101–111)
CO2: 34 mmol/L — AB (ref 22–32)
CO2: 33 mmol/L — AB (ref 22–32)
CO2: 33 mmol/L — AB (ref 22–32)
CO2: 36 mmol/L — AB (ref 22–32)
CO2: 31 mmol/L (ref 22–32)
CO2: 34 mmol/L — AB (ref 22–32)
Calcium: 7.5 mg/dL — ABNORMAL LOW (ref 8.9–10.3)
Calcium: 7.7 mg/dL — ABNORMAL LOW (ref 8.9–10.3)
Calcium: 6.8 mg/dL — ABNORMAL LOW (ref 8.9–10.3)
Calcium: 7.7 mg/dL — ABNORMAL LOW (ref 8.9–10.3)
Chloride: 78 mmol/L — ABNORMAL LOW (ref 101–111)
Chloride: 79 mmol/L — ABNORMAL LOW (ref 101–111)
Chloride: 79 mmol/L — ABNORMAL LOW (ref 101–111)
Chloride: 78 mmol/L — ABNORMAL LOW (ref 101–111)
Chloride: 83 mmol/L — ABNORMAL LOW (ref 101–111)
Creatinine, Ser: 0.46 mg/dL (ref 0.44–1.00)
Creatinine, Ser: 0.49 mg/dL (ref 0.44–1.00)
Creatinine, Ser: 0.43 mg/dL — ABNORMAL LOW (ref 0.44–1.00)
Creatinine, Ser: 0.46 mg/dL (ref 0.44–1.00)
Creatinine, Ser: 0.38 mg/dL — ABNORMAL LOW (ref 0.44–1.00)
Creatinine, Ser: 0.56 mg/dL (ref 0.44–1.00)
GFR calc Af Amer: >60 mL/min (ref >60)
GFR calc Af Amer: >60 mL/min (ref >60)
GFR calc Af Amer: >60 mL/min (ref >60)
GFR calc Af Amer: >60 mL/min (ref >60)
GFR calc Af Amer: >60 mL/min (ref >60)
GFR calc Af Amer: >60 mL/min (ref >60)
GFR calc non Af Amer: >60 mL/min (ref >60)
GFR calc non Af Amer: >60 mL/min (ref >60)
GFR calc non Af Amer: >60 mL/min (ref >60)
GLUCOSE: 154 mg/dL — AB (ref 65–99)
GLUCOSE: 106 mg/dL — AB (ref 65–99)
GLUCOSE: 80 mg/dL (ref 65–99)
GLUCOSE: 84 mg/dL (ref 65–99)
GLUCOSE: 134 mg/dL — AB (ref 65–99)
GLUCOSE: 166 mg/dL — AB (ref 65–99)
POTASSIUM: 3.4 mmol/L — AB (ref 3.5–5.1)
POTASSIUM: 3.3 mmol/L — AB (ref 3.5–5.1)
Potassium: 3.3 mmol/L — ABNORMAL LOW (ref 3.5–5.1)
Potassium: 3.7 mmol/L (ref 3.5–5.1)
Potassium: 3.8 mmol/L (ref 3.5–5.1)
Potassium: 4.2 mmol/L (ref 3.5–5.1)
Sodium: 120 mmol/L — ABNORMAL LOW (ref 135–145)
Sodium: 121 mmol/L — ABNORMAL LOW (ref 135–145)
Sodium: 121 mmol/L — ABNORMAL LOW (ref 135–145)
Sodium: 123 mmol/L — ABNORMAL LOW (ref 135–145)
Sodium: 122 mmol/L — ABNORMAL LOW (ref 135–145)
Sodium: 120 mmol/L — ABNORMAL LOW (ref 135–145)

## 2018-01-11 MED ORDER — GENERIC EXTERNAL MEDICATION
Status: DC
Start: ? — End: 2018-01-11

## 2018-01-11 MED ORDER — FUROSEMIDE 10 MG/ML IJ SOLN
40.0000 mg | Freq: Once | INTRAMUSCULAR | Status: AC
  Administered 2018-01-11: 40 mg via INTRAVENOUS
  Filled 2018-01-11: qty 4

## 2018-01-11 MED ORDER — POTASSIUM CHLORIDE 20 MEQ/15ML (10%) PO SOLN
40.0000 meq | ORAL | Status: DC
  Administered 2018-01-11: 40 meq via ORAL
  Filled 2018-01-11: qty 30

## 2018-01-11 MED ORDER — DESMOPRESSIN ACETATE 4 MCG/ML IJ SOLN
2.0000 ug | Freq: Four times a day (QID) | INTRAMUSCULAR | Status: DC
  Filled 2018-01-11: qty 1

## 2018-01-11 MED ORDER — ENOXAPARIN SODIUM 60 MG/0.6ML ~~LOC~~ SOLN
55.0000 mg | SUBCUTANEOUS | Status: DC
  Administered 2018-01-11 – 2018-01-14 (×4): 55 mg via SUBCUTANEOUS
  Filled 2018-01-11 (×4): qty 0.6

## 2018-01-11 MED ORDER — DEXAMETHASONE SODIUM PHOSPHATE 4 MG/ML IJ SOLN
4.0000 mg | Freq: Every day | INTRAMUSCULAR | Status: DC
  Administered 2018-01-11 – 2018-01-14 (×4): 4 mg via INTRAVENOUS
  Filled 2018-01-11 (×4): qty 1

## 2018-01-11 MED ORDER — POTASSIUM CHLORIDE CRYS ER 20 MEQ PO TBCR
40.0000 meq | EXTENDED_RELEASE_TABLET | Freq: Once | ORAL | Status: AC
  Administered 2018-01-11: 40 meq via ORAL
  Filled 2018-01-11: qty 2

## 2018-01-11 NOTE — Progress Notes (Signed)
Name: Stephanie Baldwin MRN:   [id number] DOB:   [birth year]         ADMISSION DATE:  01/09/2018 CONSULTATION DATE:  01/09/18  REFERRING MD:  Laqueta Jean  CHIEF COMPLAINT:  Respiratory failure and severe hyponatremia  HISTORY OF PRESENT ILLNESS:   Stephanie Baldwin is a 57 y/o woman who presented to [facility] with lethargy, weakness, confusion, and shortness of breath on 4/9 where she was found to be edematous and hyponatremic to 105. Prior to this she was noted to have nausea, vomiting, and very little PO intake for 2 weeks leading up to this admission. She was placed on Bipap for respiratory distress and also started on hypertonic saline for symptomatic hyponatremia. Over the next day sodium corrected rapidly to 124 but she developed worsened mental status and hypercapnea and hypoxia on ABG so was intubated. Due to critical condition and patient's family concerns about the level of care available she was transferred to [facility] ICU as a tertiary care site. She arrived in a stable condition on the ventilator with her mother, sister, and niece present at bedside.  Blood Pressure (Abnormal) 99/52   Pulse 75   Temperature 98.6 F (37 C) (Axillary)   Respiration 17   Height 5\' 2"  (1.575 m) Comment: per family  Weight 256 lb 2.8 oz (116.2 kg)   Oxygen Saturation 97%   Body Mass Index 46.85 kg/m    Vent Mode: PCV;BIPAP FiO2 (%):  [40 %-60 %] 40 % Set Rate:  [8 bmp-12 bmp] 8 bmp PEEP:  [5 cmH20-8 cmH20] 8 cmH20 Pressure Support:  [5 cmH20-8 cmH20] 8 cmH20   Intake/Output Summary (Last 24 hours) at 01/11/2018 0854 Last data filed at 01/11/2018 4098 Gross per 24 hour  Intake 2659.5 ml  Output 805 ml  Net 1854.5 ml    Physical exam General: 57 year old white female currently on BiPAP.  She is in no acute distress, more awake and interactive HEENT: BiPAP mask in place.  No jugular venous distention sclera are nonicteric Pulmonary: Diminished in the bases no accessory  use Cardiac: Regular rate and rhythm without murmur rub or gallop Abdomen: Soft, nontender, positive bowel sounds Extremities/musculoskeletal: Warm, dry, does have dependent edema today Neuro: Awake oriented no focal motor deficits  CBC Recent Labs    01/09/18 2250  WBC 12.8*  HGB 15.5*  HCT 46.4*  PLT 145*    Coag's Recent Labs    01/09/18 2250  INR 1.29    BMET Recent Labs    01/10/18 2209 01/11/18 0200 01/11/18 0600  NA 123* 120* 121*  K 3.5 3.3* 3.7  CL 79* 78* 79*  CO2 35* 34* 33*  BUN <5* <5* <5*  CREATININE 0.55 0.46 0.49  GLUCOSE 152* 154* 106*    Electrolytes Recent Labs    01/09/18 2250  01/10/18 2209 01/11/18 0200 01/11/18 0600  CALCIUM 7.6*   < > 7.6* 7.5* 7.6*  MG 2.1  --   --   --   --   PHOS 3.9  --   --   --   --    < > = values in this interval not displayed.    Sepsis Markers Recent Labs    01/09/18 2250  PROCALCITON <0.10    ABG Recent Labs    01/09/18 2200  PHART 7.331*  PCO2ART 76.5*  PO2ART 102    Liver Enzymes Recent Labs    01/09/18 2250 01/10/18 1019  AST 27 27  ALT 20 18  ALKPHOS 60 63  BILITOT 1.7* 1.4*  ALBUMIN 3.3* 3.1*    Cardiac Enzymes No results for input(s): TROPONINI, PROBNP in the last 72 hours.  Glucose Recent Labs    01/09/18 2055 01/09/18 2326 01/10/18 0334 01/10/18 0734 01/10/18 1143  GLUCAP 145* 113* 94 90 90    Imaging Dg Abd 1 View  Result Date: 01/09/2018 CLINICAL DATA:  Initial evaluation for enteric tube placement. EXAM: ABDOMEN - 1 VIEW COMPARISON:  None. FINDINGS: Enteric tube in place with tip in side hole overlying the gastric body, well beyond the GE junction. Tip projects superiorly towards the gastric fundus. Bowel gas pattern within normal limits without obstruction or ileus. No abnormal bowel wall thickening. No acute abnormality within the visualized abdomen. Degenerative changes noted within lower lumbar spine. IMPRESSION: Tip and side hole of enteric tube overlying  the stomach, well beyond the GE junction. Electronically Signed   By: Rise MuBenjamin  McClintock M.D.   On: 01/09/2018 22:16   [place] Abdomen Complete  Result Date: 01/10/2018 CLINICAL DATA:  57 year old female with cirrhosis.  Hyponatremia. EXAM: ABDOMEN ULTRASOUND COMPLETE COMPARISON:  Portable abdomen radiograph 01/09/2018. FINDINGS: Gallbladder: No gallstones or wall thickening visualized. No sonographic Murphy sign noted by sonographer. Common bile duct: Diameter: 2 millimeters, normal Liver: Coarse hepatic echotexture, with a degree of suspected diffuse increased echogenicity. No discrete liver lesion or biliary ductal dilatation is identified. Portal vein is patent on color Doppler imaging with normal direction of blood flow towards the liver (image 41). IVC: No abnormality visualized. Pancreas: The pancreatic head and neck are identified with questionable partial atrophy (image 51). Spleen: No splenomegaly, estimated splenic length 6.4 centimeters. Right Kidney: Length: 12.8 centimeters. Echogenicity within normal limits. No mass or hydronephrosis visualized. Left Kidney: Length: Size estimated at 12.5 centimeters, the left kidney is not as well visualized as the right. No definite left hydronephrosis. Abdominal aorta: Incompletely visualized due to overlying bowel gas, visualized portions within normal limits. Other findings: Possible right pleural effusion, uncertain. IMPRESSION: 1. No acute findings identified in the abdomen by ultrasound. 2. Coarse liver echotexture compatible with chronic liver disease. Possible hepatic steatosis. No discrete liver lesion identified. Electronically Signed   By: Odessa FlemingH  Hall M.D.   On: 01/10/2018 17:03   Dg Chest Port 1 View  Result Date: 01/11/2018 CLINICAL DATA:  Follow-up pulmonary edema EXAM: PORTABLE CHEST 1 VIEW COMPARISON:  01/09/2018 FINDINGS: Endotracheal tube and nasogastric catheter have been removed in the interval. A right-sided PICC line is again noted in  satisfactory position. Cardiac shadow remains mildly enlarged with central vascular congestion although the degree of interstitial edema has improved from the prior study. Bibasilar atelectatic changes are noted. No sizable effusion is seen. IMPRESSION: Improved vascular congestion and interstitial edema. Bibasilar atelectatic changes are noted. Electronically Signed   By: Alcide CleverMark  Lukens M.D.   On: 01/11/2018 07:13   Dg Chest Port 1 View  Result Date: 01/09/2018 CLINICAL DATA:  Initial evaluation for endotracheal tube placement. EXAM: PORTABLE CHEST 1 VIEW COMPARISON:  None. FINDINGS: Patient is intubated with the tip of an endotracheal tube well positioned 5 cm above the carina. Enteric tube courses into the abdomen. Right PICC catheter in place with tip overlying the cavoatrial junction. Cardiomegaly.  Mediastinal silhouette normal. Lungs hypoinflated. Diffuse vascular congestion with interstitial prominence, suggesting pulmonary interstitial edema. Small bilateral pleural effusions, left greater than right. Associated bibasilar opacities likely reflect atelectasis and/or edema, although infiltrates could be considered in the correct clinical setting. No pneumothorax. No acute osseous abnormality.  IMPRESSION: 1. Tip of endotracheal tube well positioned 5 cm above the carina. 2. Cardiomegaly with moderate diffuse pulmonary interstitial edema and left greater than right small bilateral pleural effusions. 3. Associated bibasilar opacities favored to reflect atelectasis and/or edema. Infiltrates could be considered in the correct clinical setting. Electronically Signed   By: Rise Mu M.D.   On: 01/09/2018 22:15   Impression/plan  Acute metabolic encephalopathy-->resolved -suspect was d/t symptomatic hyponatremia then complicated by sedation -she is awake and non-focal. Seems appropriate.  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 This can be delayed onset  Plan Hold any sedation  Close obs  Na (not to exceed correction of >8 in 24hrs)  Hypervolemic hypoosmolar Hyponatremia (symptomatic) ? Liver disease vs heart failure ?   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  -course c/b rapid correction (105 to 124 in less than 24hrs)  -Na cont to rise some; now Na back to 120.  Plan Dc desmopressin KVO D5W Lasix x1 Trend chemistry   Acute hypercarbic and hypoxic respiratory failure  Probable undiagnosed OSA ? Volume overload PCXR: bibasilar atx. prob element of edema Now on BIPAP Plan Lasix x1 BIPAP PRN and mandatory at HS Wean FIO2  Cardiomegaly  W/ probable diastolic dysfxn  -ECHO: EF 60-65%; LA mildly dilated RA mildly dilated.  -bnp not very impressive; given ECHO findings I wonder about chronic hypoxia and cor pulmonale  Plan Cont tele  F/u echo  Possible chronic liver disease  -abd [place]: 2. Coarse liver echotexture compatible with chronic liver disease. Possible hepatic steatosis. No discrete liver lesion identified Plan Send hepatitis panel Will need GI as out-pt   Probable adrenal insuff  Plan Monitor    Thrombocytopenia Plan Trend cbc Cont LMWH  Mild leukocytosis Plan F/u culture data    We will keep her in the intensive care today.  Primary treatment for today will be due diuresis her.  Try to get her off BiPAP.  Continue to watch sodium level, but stopping desmopressin, and administering diuresis I believe will correct this.  Simonne Martinet ACNP-BC [facility] Pulmonary/Critical Care Pager # [phone] OR # [phone] if no answer

## 2018-01-11 NOTE — Plan of Care (Signed)
  Problem: Coping: Goal: Level of anxiety will decrease Outcome: Not Progressing Note:  Patient is agoraphobic and very anxious. Also anxious about having a nicotine patch. States she smokes frequently at home.  Able to sleep well after patch placed.    Problem: Elimination: Goal: Will not experience complications related to bowel motility Outcome: Not Progressing Note:  Patient states she has not eaten in 2 weeks prior to hospitalization. No bowel movement in several days; however, bowel sounds active.    Problem: Clinical Measurements: Goal: Respiratory complications will improve Outcome: Progressing Note:  Pt successfully extubated during AM shift. Patient placed on HFNC at 12 L during day, and Bipap for PM shift d/t likely sleep apnea. Patient supposed to be checked as outpatient after hospitalization.

## 2018-01-11 NOTE — Progress Notes (Signed)
    Recent Labs  Lab 01/09/18 2250  01/10/18 1450 01/10/18 1550 01/10/18 1832 01/10/18 2209 01/11/18 0200  NA 123*   < > 126* 127* 123* 123* 120*  K 2.9*   < > 3.5 3.4* 3.8 3.5 3.3*  CL 79*   < > 82* 84* 80* 79* 78*  CO2 34*   < > 34* 33* 34* 35* 34*  GLUCOSE 104*   < > 104* 94 143* 152* 154*  BUN <5*   < > <5* <5* <5* <5* <5*  CREATININE 0.58   < > 0.54 0.50 0.55 0.55 0.46  CALCIUM 7.6*   < > 7.8* 7.4* 7.5* 7.6* 7.5*  MG 2.1  --   --   --   --   --   --   PHOS 3.9  --   --   --   --   --   --    < > = values in this interval not displayed.   Na 105 on 4/9/.19 Now -> Na 120 th D5W at 150cc/h and DDAVP  This is 15meq rise in 3 days which is now on-target rise  Creat ok  Plan Reduce d5w from 150mL -> kvo 5910ml/h Continue DDAVP for now   Dr. Kalman ShanMurali Nicholous Girgenti, M.D., [facility]C.P Pulmonary and Critical Care Medicine Staff Physician, [facility] Director - Interstitial Lung Disease  Program  Pulmonary Fibrosis [facility] at [facility] [place], [place], [id number]  Pager: [phone], If no answer or between  15:00h - 7:00h: call [phone] Telephone: [phone]

## 2018-01-12 ENCOUNTER — Inpatient Hospital Stay (HOSPITAL_COMMUNITY): Payer: Medicaid Other

## 2018-01-12 LAB — HEPATITIS PANEL, ACUTE
HCV Ab: <0.1 s/co ratio (ref 0.0–0.9)
HEP B C IGM: NEGATIVE
Hep A IgM: NEGATIVE
Hepatitis B Surface Ag: NEGATIVE

## 2018-01-12 LAB — BASIC METABOLIC PANEL
Anion gap: 9 (ref 5–15)
Anion gap: 9 (ref 5–15)
Anion gap: 11 (ref 5–15)
Anion gap: 11 (ref 5–15)
BUN: <5 mg/dL — ABNORMAL LOW (ref 6–20)
BUN: 5 mg/dL — AB (ref 6–20)
BUN: 7 mg/dL (ref 6–20)
CALCIUM: 7.6 mg/dL — AB (ref 8.9–10.3)
CALCIUM: 7.8 mg/dL — AB (ref 8.9–10.3)
CHLORIDE: 78 mmol/L — AB (ref 101–111)
CHLORIDE: 80 mmol/L — AB (ref 101–111)
CHLORIDE: 86 mmol/L — AB (ref 101–111)
CO2: 34 mmol/L — ABNORMAL HIGH (ref 22–32)
CO2: 34 mmol/L — ABNORMAL HIGH (ref 22–32)
CO2: 33 mmol/L — AB (ref 22–32)
CO2: 31 mmol/L (ref 22–32)
CREATININE: 0.45 mg/dL (ref 0.44–1.00)
CREATININE: 0.45 mg/dL (ref 0.44–1.00)
CREATININE: 0.47 mg/dL (ref 0.44–1.00)
Calcium: 8.3 mg/dL — ABNORMAL LOW (ref 8.9–10.3)
Calcium: 8.6 mg/dL — ABNORMAL LOW (ref 8.9–10.3)
Chloride: 76 mmol/L — ABNORMAL LOW (ref 101–111)
Creatinine, Ser: 0.48 mg/dL (ref 0.44–1.00)
GFR calc Af Amer: >60 mL/min (ref >60)
GFR calc Af Amer: >60 mL/min (ref >60)
GFR calc Af Amer: >60 mL/min (ref >60)
GFR calc non Af Amer: >60 mL/min (ref >60)
GFR calc non Af Amer: >60 mL/min (ref >60)
GFR calc non Af Amer: >60 mL/min (ref >60)
GLUCOSE: 142 mg/dL — AB (ref 65–99)
GLUCOSE: 131 mg/dL — AB (ref 65–99)
Glucose, Bld: 120 mg/dL — ABNORMAL HIGH (ref 65–99)
Glucose, Bld: 147 mg/dL — ABNORMAL HIGH (ref 65–99)
POTASSIUM: 4.2 mmol/L (ref 3.5–5.1)
Potassium: 4.3 mmol/L (ref 3.5–5.1)
Potassium: 4.4 mmol/L (ref 3.5–5.1)
Potassium: 4.9 mmol/L (ref 3.5–5.1)
SODIUM: 119 mmol/L — AB (ref 135–145)
Sodium: 121 mmol/L — ABNORMAL LOW (ref 135–145)
Sodium: 124 mmol/L — ABNORMAL LOW (ref 135–145)
Sodium: 128 mmol/L — ABNORMAL LOW (ref 135–145)

## 2018-01-12 LAB — TRIGLYCERIDES: TRIGLYCERIDES: 51 mg/dL (ref <150)

## 2018-01-12 LAB — CHOLESTEROL, TOTAL: Cholesterol: 146 mg/dL (ref 0–200)

## 2018-01-12 MED ORDER — GENERIC EXTERNAL MEDICATION
Status: DC
Start: ? — End: 2018-01-12

## 2018-01-12 MED ORDER — ALPRAZOLAM 0.25 MG PO TABS
0.2500 mg | ORAL_TABLET | Freq: Two times a day (BID) | ORAL | Status: DC | PRN
  Administered 2018-01-12 – 2018-01-13 (×2): 0.25 mg via ORAL
  Filled 2018-01-12 (×2): qty 1

## 2018-01-12 MED ORDER — FUROSEMIDE 10 MG/ML IJ SOLN
40.0000 mg | Freq: Four times a day (QID) | INTRAMUSCULAR | Status: AC
  Administered 2018-01-12 – 2018-01-13 (×3): 40 mg via INTRAVENOUS
  Filled 2018-01-12 (×3): qty 4

## 2018-01-12 MED ORDER — POTASSIUM CHLORIDE CRYS ER 20 MEQ PO TBCR
40.0000 meq | EXTENDED_RELEASE_TABLET | Freq: Once | ORAL | Status: AC
Start: 2018-01-12 — End: 2018-01-12
  Administered 2018-01-12: 40 meq via ORAL
  Filled 2018-01-12: qty 2

## 2018-01-12 NOTE — Progress Notes (Signed)
Name: Stephanie Baldwin MRN:   [id number] DOB:   Apr 12, 1961         ADMISSION DATE:  01/09/2018 CONSULTATION DATE:  01/09/18  REFERRING MD:  Laqueta JeanMohsin Arshad  CHIEF COMPLAINT:  Respiratory failure and severe hyponatremia  HISTORY OF PRESENT ILLNESS:   Stephanie Baldwin is a 89 y/o woman who [facility] with lethargy, weakness, confusion, and shortness of breath on 4/9 where she was found to be edematous and hyponatremic to 105. Prior to this she was noted to have nausea, vomiting, and very little PO intake for 2 weeks leading up to this admission. She was placed on Bipap for respiratory distress and also started on hypertonic saline for symptomatic hyponatremia. Over the next day sodium corrected rapidly to 124 but she developed worsened mental status and hypercapnea and hypoxia on ABG so was intubated. Due to critical condition and patient's family concerns about the level of care available [facility] as [facility] care [facility]. She arrived in a stable condition on the ventilator with her mother, sister, and niece present at bedside. Subjective Sleep well last night, work of breathing about the same.  Objective Blood Pressure 117/72   Pulse 69   Temperature 98.2 F (36.8 C) (Axillary)   Respiration 16   Height 5\' 2"  (1.575 m) Comment: per family  Weight 247 lb 9.2 oz (112.3 kg)   Oxygen Saturation 96%   Body Mass Index 45.28 kg/m  CVP:  [23 mmHg] 23 mmHg Vent Mode: BIPAP;PCV FiO2 (%):  [45 %-50 %] 50 % Set Rate:  [8 bmp] 8 bmp PEEP:  [6 cmH20] 6 cmH20   Intake/Output Summary (Last 24 hours) at 01/12/2018 [id number] Last data filed at 01/12/2018 0700 Gross per 24 hour  Intake 665.17 ml  Output 4605 ml  Net -3939.83 ml    Physical exam General: 57 year old white female resting comfortably in bed she remains on high flow facemask HEENT: Normocephalic atraumatic sclerae nonicteric.  Mucous membranes moist.  No jugular venous  distention. Pulmonary: No accessory use.  Bibasilar rales. Cardiac: Regular rate and rhythm without murmur rub or gallop Extremities: Trace lower extremity edema.  Brisk cap refill.  Strong pulses. Abdomen: Soft nontender no organomegaly Neuro: Awake alert oriented no focal deficits  CBC Recent Labs    01/09/18 2250  WBC 12.8*  HGB 15.5*  HCT 46.4*  PLT 145*    Coag's Recent Labs    01/09/18 2250  INR 1.29    BMET Recent Labs    01/11/18 2202 01/12/18 0157 01/12/18 0542  NA 120* 119* 121*  K 4.2 4.2 4.3  CL 76* 76* 78*  CO2 34* 34* 34*  BUN <5* <5* <5*  CREATININE 0.56 0.48 0.45  GLUCOSE 166* 142* 120*    Electrolytes Recent Labs    01/09/18 2250  01/11/18 2202 01/12/18 0157 01/12/18 0542  CALCIUM 7.6*   < > 7.7* 7.6* 7.8*  MG 2.1  --   --   --   --   PHOS 3.9  --   --   --   --    < > = values in this interval not displayed.    Sepsis Markers Recent Labs    01/09/18 2250  PROCALCITON <0.10    ABG Recent Labs    01/09/18 2200  PHART 7.331*  PCO2ART 76.5*  PO2ART 102    Liver Enzymes Recent Labs    01/09/18 2250 01/10/18 1019  AST 27 27  ALT 20 18  ALKPHOS 60 63  BILITOT 1.7* 1.4*  ALBUMIN 3.3* 3.1*    Cardiac Enzymes No results for input(s): TROPONINI, PROBNP in the last 72 hours.  Glucose Recent Labs    01/09/18 2055 01/09/18 2326 01/10/18 0334 01/10/18 0734 01/10/18 1143  GLUCAP 145* 113* 94 90 90    Imaging US Abdomen Complete  Result Date: 01/10/2018 CLINICAL DATA:  57 year old female with cirrhosis.  Hyponatremia. EXAM: ABDOMEN ULTRASOUND COMPLETE COMPARISON:  Portable abdomen radiograph 01/09/2018. FINDINGS: Gallbladder: No gallstones or wall thickening visualized. No sonographic Murphy sign noted by sonographer. Common bile duct: Diameter: 2 millimeters, normal Liver: Coarse hepatic echotexture, with a degree of suspected diffuse increased echogenicity. No discrete liver lesion or biliary ductal dilatation is  identified. Portal vein is patent on color Doppler imaging with normal direction of blood flow towards the liver (image 41). IVC: No abnormality visualized. Pancreas: The pancreatic head and neck are identified with questionable partial atrophy (image 51). Spleen: No splenomegaly, estimated splenic length 6.4 centimeters. Right Kidney: Length: 12.8 centimeters. Echogenicity within normal limits. No mass or hydronephrosis visualized. Left Kidney: Length: Size estimated at 12.5 centimeters, the left kidney is not as well visualized as the right. No definite left hydronephrosis. Abdominal aorta: Incompletely visualized due to overlying bowel gas, visualized portions within normal limits. Other findings: Possible right pleural effusion, uncertain. IMPRESSION: 1. No acute findings identified in the abdomen by ultrasound. 2. Coarse liver echotexture compatible with chronic liver disease. Possible hepatic steatosis. No discrete liver lesion identified. Electronically Signed   By: Odessa Fleming M.D.   On: 01/10/2018 17:03   Dg Chest Port 1 View  Result Date: 01/12/2018 CLINICAL DATA:  Pulmonary edema. EXAM: PORTABLE CHEST 1 VIEW COMPARISON:  Chest x-ray from yesterday. FINDINGS: Unchanged right PICC line with the tip in the proximal right atrium. Stable cardiomegaly. Improved interstitial edema and bibasilar atelectasis. No large pleural effusion. No pneumothorax. No acute osseous abnormality. IMPRESSION: 1. Improved interstitial edema and bibasilar atelectasis. Electronically Signed   By: Obie Dredge M.D.   On: 01/12/2018 08:14   Dg Chest Port 1 View  Result Date: 01/11/2018 CLINICAL DATA:  Follow-up pulmonary edema EXAM: PORTABLE CHEST 1 VIEW COMPARISON:  01/09/2018 FINDINGS: Endotracheal tube and nasogastric catheter have been removed in the interval. A right-sided PICC line is again noted in satisfactory position. Cardiac shadow remains mildly enlarged with central vascular congestion although the degree of  interstitial edema has improved from the prior study. Bibasilar atelectatic changes are noted. No sizable effusion is seen. IMPRESSION: Improved vascular congestion and interstitial edema. Bibasilar atelectatic changes are noted. Electronically Signed   By: Alcide Clever M.D.   On: 01/11/2018 07:13   Impression/plan  Acute metabolic encephalopathy-->resolved -suspect was d/t symptomatic hyponatremia then complicated by sedation -she is awake and non-focal. Seems appropriate.  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 This can be delayed onset  Plan No sedation  Hypervolemic hypoosmolar Hyponatremia (symptomatic) ? Liver disease vs heart failure ?   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  -course c/b rapid correction (105 to 124 in less than 24hrs)  Sodium level stable around 120.  She had D5 water this was discontinued this morning. Plan Continue aggressive diuresis today Change serial chemistries to every 6 hours Strict intake and output   Acute hypercarbic and hypoxic respiratory failure  Probable undiagnosed OSA Chest x-ray personally reviewed on 4/13 demonstrates bilateral pulmonary edema and worsening basilar atelectasis Still requiring high oxygen Did not tolerate BiPAP well Plan Schedule Lasix  every 6 hours High flow oxygen and AutoSet CPAP at night Mobilize   Cardiomegaly  W/ probable diastolic dysfxn  -ECHO: EF 60-65%; LA mildly dilated RA mildly dilated.  -bnp not very impressive; given ECHO findings I wonder about chronic hypoxia and cor pulmonale  Plan Avoid hypoxia Aggressive diuresis Continue telemetry monitoring  Possible chronic liver disease  -abd [place]: 2. Coarse liver echotexture compatible with chronic liver disease. Possible hepatic steatosis. No discrete liver lesion identified Plan Will need GI assessment as outpatient  Probable adrenal insuff  Plan Continue Decadron   Thrombocytopenia Plan Trend CBC Continue low molecular weight  heparin  Mild leukocytosis Plan F/u culture data    Her oxygen requirements remain high.  Her sodium seems to be holding around 120.  I wonder what her baseline is.  Her CVP suggests volume overload as this her chest x-ray today's focus will be diuresis to hopefully wean her oxygen.  We will mobilize her today, push diuresis, try heated high flow during day, and AutoSet CPAP at night.  Hopefully will be able to get her out of the intensive care in the next 24.  My critical care time is 35 minutes  Simonne Martinet ACNP-BC [facility] Pulmonary/Critical Care Pager # [phone] OR # [phone] if no answer

## 2018-01-12 NOTE — Progress Notes (Signed)
eLink Physician-Brief Progress Note Patient Name: Stephanie Baldwin DOB: [birth year] MRN: [id number]   Date of Service  01/12/2018  HPI/Events of Note  Sodium 119  eICU Interventions  Stop D5W at 10 cc/hr     Intervention Category Minor Interventions: Electrolytes abnormality - evaluation and management  Henry RusselSMITH, Tahisha Hakim, P 01/12/2018, 2:58 AM

## 2018-01-13 DIAGNOSIS — J810 Acute pulmonary edema: Secondary | ICD-10-CM

## 2018-01-13 DIAGNOSIS — J811 Chronic pulmonary edema: Secondary | ICD-10-CM

## 2018-01-13 LAB — BASIC METABOLIC PANEL
Anion gap: 11 (ref 5–15)
Anion gap: 10 (ref 5–15)
BUN: 5 mg/dL — AB (ref 6–20)
BUN: 5 mg/dL — AB (ref 6–20)
CHLORIDE: 87 mmol/L — AB (ref 101–111)
CHLORIDE: 86 mmol/L — AB (ref 101–111)
CO2: 33 mmol/L — AB (ref 22–32)
CO2: 37 mmol/L — AB (ref 22–32)
CREATININE: 0.47 mg/dL (ref 0.44–1.00)
CREATININE: 0.46 mg/dL (ref 0.44–1.00)
Calcium: 8.2 mg/dL — ABNORMAL LOW (ref 8.9–10.3)
Calcium: 8.7 mg/dL — ABNORMAL LOW (ref 8.9–10.3)
GFR calc Af Amer: >60 mL/min (ref >60)
GFR calc non Af Amer: >60 mL/min (ref >60)
GFR calc non Af Amer: >60 mL/min (ref >60)
Glucose, Bld: 109 mg/dL — ABNORMAL HIGH (ref 65–99)
Glucose, Bld: 111 mg/dL — ABNORMAL HIGH (ref 65–99)
Potassium: 3.7 mmol/L (ref 3.5–5.1)
Potassium: 3.7 mmol/L (ref 3.5–5.1)
Sodium: 131 mmol/L — ABNORMAL LOW (ref 135–145)
Sodium: 133 mmol/L — ABNORMAL LOW (ref 135–145)

## 2018-01-13 MED ORDER — FUROSEMIDE 10 MG/ML IJ SOLN
40.0000 mg | Freq: Two times a day (BID) | INTRAMUSCULAR | Status: DC
  Administered 2018-01-13 – 2018-01-15 (×5): 40 mg via INTRAVENOUS
  Filled 2018-01-13 (×6): qty 4

## 2018-01-13 NOTE — Progress Notes (Signed)
Pt on CPAP @ 10cm H2o per protocol with 15L Bleed in. Pt tolerated well ~4 hrs. Pt had several desat's  to mid low 80's. Placed back on HFNC 25L/50%. Pt tolerating well RR 18-20 SPO2 94%. RT will continue to monitor

## 2018-01-13 NOTE — Progress Notes (Addendum)
Name: Stephanie Baldwin MRN:   [id number] DOB:   March 26, 1961         ADMISSION DATE:  01/09/2018 CONSULTATION DATE:  01/09/18  REFERRING MD:  Laqueta Jean  CHIEF COMPLAINT:  Respiratory failure and severe hyponatremia  HISTORY OF PRESENT ILLNESS:   Stephanie Baldwin is a 57 y/o woman who presented to [facility] with lethargy, weakness, confusion, and shortness of breath on 4/9 where she was found to be edematous and hyponatremic to 105. Prior to this she was noted to have nausea, vomiting, and very little PO intake for 2 weeks leading up to this admission. She was placed on Bipap for respiratory distress and also started on hypertonic saline for symptomatic hyponatremia. Over the next day sodium corrected rapidly to 124 but she developed worsened mental status and hypercapnea and hypoxia on ABG so was intubated. Due to critical condition and patient's family concerns about the level of care available she was transferred to [facility] ICU as a tertiary care site. She arrived in a stable condition on the ventilator with her mother, sister, and niece present at bedside. Subjective Feeling better  Objective Blood Pressure 108/66   Pulse 73   Temperature 97.8 F (36.6 C) (Axillary)   Respiration 16   Height 5\' 2"  (1.575 m) Comment: per family  Weight 243 lb 13.3 oz (110.6 kg)   Oxygen Saturation 97%   Body Mass Index 44.60 kg/m  CVP:  [15 mmHg] 15 mmHg FiO2 (%):  [40 %-60 %] 40 %   Intake/Output Summary (Last 24 hours) at 01/13/2018 0828 Last data filed at 01/13/2018 0200 Gross per 24 hour  Intake 150 ml  Output 8525 ml  Net -8375 ml    Physical exam General 57 year old female currently resting in bed she is in no acute distress she continues to improve daily HEENT: She has a pressure dressing over the bridge of her nose, mucous membranes are moist, there is no jugular venous distention voice is strong Pulmonary: Basilar rales posteriorly no wheezing or rhonchi equal chest  rise without accessory use Cardiac: Regular rate and rhythm without murmur rub or gallop Abdomen: Soft nontender no organomegaly positive bowel sounds next extremities: Warm, brisk cap refill, no significant edema Neuro: Awake oriented no focal deficits  CBC No results for input(s): WBC, HGB, HCT, PLT in the last 72 hours.  Coag's No results for input(s): APTT, INR in the last 72 hours.  BMET Recent Labs    01/12/18 1703 01/13/18 0256 01/13/18 0600  NA 128* 131* 133*  K 4.9 3.7 3.7  CL 86* 87* 86*  CO2 31 33* 37*  BUN 7 5* 5*  CREATININE 0.47 0.47 0.46  GLUCOSE 131* 109* 111*    Electrolytes Recent Labs    01/12/18 1703 01/13/18 0256 01/13/18 0600  CALCIUM 8.6* 8.2* 8.7*    Sepsis Markers No results for input(s): PROCALCITON, O2SATVEN in the last 72 hours.  Invalid input(s): LACTICACIDVEN  ABG No results for input(s): PHART, PCO2ART, PO2ART in the last 72 hours.  Liver Enzymes Recent Labs    01/10/18 1019  AST 27  ALT 18  ALKPHOS 63  BILITOT 1.4*  ALBUMIN 3.1*    Cardiac Enzymes No results for input(s): TROPONINI, PROBNP in the last 72 hours.  Glucose Recent Labs    01/10/18 1143  GLUCAP 90    Imaging Dg Chest Port 1 View  Result Date: 01/12/2018 CLINICAL DATA:  Pulmonary edema. EXAM: PORTABLE CHEST 1 VIEW COMPARISON:  Chest x-ray from  yesterday. FINDINGS: Unchanged right PICC line with the tip in the proximal right atrium. Stable cardiomegaly. Improved interstitial edema and bibasilar atelectasis. No large pleural effusion. No pneumothorax. No acute osseous abnormality. IMPRESSION: 1. Improved interstitial edema and bibasilar atelectasis. Electronically Signed   By: Obie DredgeWilliam T Derry M.D.   On: 01/12/2018 08:14   Impression/plan  Acute metabolic encephalopathy-->resolved -suspect was d/t symptomatic hyponatremia then complicated by sedation -she is awake and non-focal. Seems appropriate.  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 This can be  delayed onset  Plan Avoid sedation  Hypervolemic hypoosmolar Hyponatremia (symptomatic) ? Liver disease vs heart failure ?   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  -course c/b rapid correction (105 to 124 in less than 24hrs)  Sodium level stable around 120.  She had D5 water this was discontinued on 4/14.  This is continuing to normalize with diuresis plan Back down on diuresis today, changed to every 12 Will change frequency of blood chemistry to every 12 as well  Acute hypercarbic and hypoxic respiratory failure  Probable undiagnosed OSA Chest x-ray personally reviewed on 4/13 demonstrates bilateral pulmonary edema and worsening basilar atelectasis -Clinically improving, she has diuresed over 11 L -Etiology of hypoxia, certainly volume overload complicated by atelectasis.  Suspect long-standing untreated obstructive sleep apnea Plan Continue Lasix, see above See if we can wean her off high flow oxygen today Continue incentive spirometry Ambulation Nocturnal AutoSet CPAP Eventually needs ABG in a.m.   Cardiomegaly  W/ probable diastolic dysfxn  -ECHO: EF 60-65%; LA mildly dilated RA mildly dilated.  -bnp not very impressive; given ECHO findings I wonder about chronic hypoxia and cor pulmonale  Plan Avoid hypoxia  continue diuresis  Possible chronic liver disease  -abd US: 2. Coarse liver echotexture compatible with chronic liver disease. Possible hepatic steatosis. 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  Weaning oxygen.  Sodium normalizing.  Feeling and looking better every day.  For today will de-escalate frequency of lab draws as well as Lasix frequency.  I think 1 of the bigger challenges  will be getting her a CPAP device for home without a sleep study.  As we get closer to discharge we need to obtain a a.m. arterial blood gas looking for hypercarbia.  We will also need to do walking oximetry looking for desaturation.  She will definitely need to be followed in our clinic with sleep medicine.  If I can get her off high flow oxygen we will move her out of the intensive care later today  DVT prophylaxis: lmwh SUP: na Diet: reg Activity: OOB Disposition : SDU  Simonne MartinetPeter E Meliza Kage ACNP-BC [facility] Pulmonary/Critical Care Pager # [phone] OR # [phone] if no answer

## 2018-01-14 DIAGNOSIS — K7469 Other cirrhosis of liver: Secondary | ICD-10-CM

## 2018-01-14 LAB — CBC
HEMATOCRIT: 53.9 % — AB (ref 36.0–46.0)
HEMOGLOBIN: 16.8 g/dL — AB (ref 12.0–15.0)
MCH: 28.4 pg (ref 26.0–34.0)
MCHC: 31.2 g/dL (ref 30.0–36.0)
MCV: 91.2 fL (ref 78.0–100.0)
Platelets: 129 10*3/uL — ABNORMAL LOW (ref 150–400)
RBC: 5.91 MIL/uL — ABNORMAL HIGH (ref 3.87–5.11)
RDW: 15.8 % — ABNORMAL HIGH (ref 11.5–15.5)
WBC: 9 10*3/uL (ref 4.0–10.5)

## 2018-01-14 LAB — BASIC METABOLIC PANEL
ANION GAP: 10 (ref 5–15)
BUN: 7 mg/dL (ref 6–20)
CALCIUM: 8.6 mg/dL — AB (ref 8.9–10.3)
CO2: 38 mmol/L — ABNORMAL HIGH (ref 22–32)
Chloride: 86 mmol/L — ABNORMAL LOW (ref 101–111)
Creatinine, Ser: 0.46 mg/dL (ref 0.44–1.00)
GLUCOSE: 107 mg/dL — AB (ref 65–99)
POTASSIUM: 3.6 mmol/L (ref 3.5–5.1)
SODIUM: 134 mmol/L — AB (ref 135–145)

## 2018-01-14 MED ORDER — POTASSIUM CHLORIDE CRYS ER 20 MEQ PO TBCR
20.0000 meq | EXTENDED_RELEASE_TABLET | Freq: Two times a day (BID) | ORAL | Status: DC
  Administered 2018-01-14 (×2): 20 meq via ORAL
  Filled 2018-01-14 (×2): qty 1

## 2018-01-14 MED ORDER — ACETAMINOPHEN 500 MG PO TABS: 500.0000 mg | ORAL_TABLET | ORAL | Status: DC | PRN

## 2018-01-14 MED ORDER — DEXAMETHASONE SODIUM PHOSPHATE 4 MG/ML IJ SOLN
2.0000 mg | Freq: Every day | INTRAMUSCULAR | Status: DC
  Administered 2018-01-15 – 2018-01-16 (×2): 2 mg via INTRAVENOUS
  Filled 2018-01-14 (×2): qty 1

## 2018-01-14 MED ORDER — DOCUSATE SODIUM 50 MG/5ML PO LIQD: 100.0000 mg | Freq: Two times a day (BID) | ORAL | Status: DC | PRN

## 2018-01-14 NOTE — Progress Notes (Signed)
Placed dream station  machine in patient room set for auto-bipap, set at ipap 20/epap 8. Tried on patient briefly with medium mask, 5l bleed in. Pt tolerated well, no distress, sats 94-96%. RT will continue to monitor.

## 2018-01-14 NOTE — Progress Notes (Signed)
[facility] TEAM 1 - Stepdown/ICU TEAM  Stephanie Baldwin  ZOX:[id number] DOB: 1961-06-19 DOA: 01/09/2018 PCP: Carin Hock, MD    Brief Narrative:  56 yo F who presented to [facility] with lethargy, weakness, confusion, and shortness of breath on 4/9 where she was found to be edematous and hyponatremic to 105. Prior to this she was noted to have nausea, vomiting, and very little PO intake for 2 weeks. She was placed on Bipap for respiratory distress and also started on hypertonic saline. Over the next day sodium corrected too rapidly to 124 and she developed worsened mental status w/ hypercapnea and hypoxia and was intubated. Due to family concerns she was transferred to [facility] ICU. She arrived in a stable condition on the ventilator with her mother, sister, and niece present at bedside.  Significant Events: 4/10 admit 4/11 TTE - mild LVH, EF 60-65% - no WMA - LA mod dilated - RA mod dilated - no comment on DD 4/15 [facility] assumed care   Subjective: Pt was seen in f/u by PCCM today, who have attended to all acute issues.  [facility] will remain as attending service, but I will not charge the pt for a visit today.    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 w/ rapid correction at OSH (105 to 124 in less than 24hrs) - Na slowly climbing to normal range at this time  Recent Labs  Lab 01/12/18 0542 01/12/18 1200 01/12/18 1703 01/13/18 0256 01/13/18 0600 01/14/18 0526  NA 121* 124* 128* 131* 133* 134*    Acute hypercarbic and hypoxic respiratory failure - Probable undiagnosed OSA CXR 4/13 noted B pulmonary edema - improving w/ signif diuresis - also suspect long-standing untreated obstructive sleep apnea - wean  O2 as able - nocturnal AutoSet CPAP for now - will need AM ABG prior to d/c to attempt to qualify her for home CPAP  Possible chronic liver disease  abd [place] noted coarse liver echotexture compatible with chronic liver disease - ?hepatic steatosis - no discrete liver lesion identified - will need outpt GI f/u   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   DVT prophylaxis: lovenox  Code Status: FULL CODE Family Communication:   Disposition Plan:   Consultants:  PCCM  Antimicrobials:  none  Objective: Blood pressure 116/73, pulse 87, temperature 98.4 F (36.9 C), temperature source Oral, resp. rate 15, height 5\' 2"  (1.575 m), weight 105.3 kg (232 lb 2.3 oz), SpO2 96 %.  Intake/Output Summary (Last 24 hours) at 01/14/2018 1025 Last data filed at 01/14/2018 0900 Gross per 24 hour  Intake -  Output 5035 ml  Net -5035 ml   Filed Weights   01/12/18 0500 01/13/18 0500 01/14/18 0500  Weight: 112.3 kg (247 lb 9.2 oz) 110.6 kg (243 lb 13.3 oz) 105.3 kg (232 lb 2.3 oz)    Examination: No exam today - all acute issues addressed per PCCM  CBC: Recent Labs  Lab 01/09/18 2250 01/14/18 0526  WBC 12.8* 9.0  NEUTROABS 10.5*  --   HGB 15.5* 16.8*  HCT 46.4* 53.9*  MCV 86.1 91.2  PLT 145* 129*   Basic  Metabolic Panel: Recent Labs  Lab 01/09/18 2250  01/13/18 0256 01/13/18 0600 01/14/18 0526  NA 123*   < > 131* 133* 134*  K 2.9*   < > 3.7 3.7 3.6  CL 79*   < > 87* 86* 86*  CO2 34*   < > 33* 37* 38*  GLUCOSE 104*   < > 109* 111* 107*  BUN <5*   < > 5* 5* 7  CREATININE 0.58   < > 0.47 0.46 0.46  CALCIUM 7.6*   < > 8.2* 8.7* 8.6*  MG 2.1  --   --   --   --   PHOS 3.9  --   --   --   --    < > = values in this interval not displayed.   GFR: Estimated Creatinine Clearance: 89.5 mL/min (by C-G formula based on SCr of 0.46  mg/dL).  Liver Function Tests: Recent Labs  Lab 01/09/18 2250 01/10/18 1019  AST 27 27  ALT 20 18  ALKPHOS 60 63  BILITOT 1.7* 1.4*  PROT 5.6* 5.5*  ALBUMIN 3.3* 3.1*   Coagulation Profile: Recent Labs  Lab 01/09/18 2250  INR 1.29    CBG: Recent Labs  Lab 01/09/18 2055 01/09/18 2326 01/10/18 0334 01/10/18 0734 01/10/18 1143  GLUCAP 145* 113* 94 90 90    Recent Results (from the past 240 hour(s))  MRSA PCR Screening     Status: None   Collection Time: 01/09/18  8:56 PM  Result Value Ref Range Status   MRSA by PCR NEGATIVE NEGATIVE Final    Comment:        The GeneXpert MRSA Assay (FDA approved for NASAL specimens only), is one component of a comprehensive MRSA colonization surveillance program. It is not intended to diagnose MRSA infection nor to guide or monitor treatment for MRSA infections. Performed at [facility], [address] [place], [place] [address]   Culture, blood (routine x 2)     Status: None (Preliminary result)   Collection Time: 01/09/18 11:16 PM  Result Value Ref Range Status   Specimen Description BLOOD LEFT HAND  Final   Special Requests   Final    BOTTLES DRAWN AEROBIC AND ANAEROBIC Blood Culture results may not be optimal due to an excessive volume of blood received in culture bottles   Culture   Final    NO GROWTH 3 DAYS Performed at [facility], [address] [place], [place] [address]    Report Status PENDING  Incomplete  Culture, blood (routine x 2)     Status: None (Preliminary result)   Collection Time: 01/09/18 11:20 PM  Result Value Ref Range Status   Specimen Description BLOOD LEFT HAND  Final   Special Requests   Final    BOTTLES DRAWN AEROBIC AND ANAEROBIC Blood Culture adequate volume   Culture   Final    NO GROWTH 3 DAYS Performed at [facility], [address], [place], [place] [address]    Report Status PENDING  Incomplete     Scheduled Meds: . dexamethasone  4 mg Intravenous Daily   . enoxaparin (LOVENOX) injection  55 mg Subcutaneous Q24H  . furosemide  40 mg Intravenous Q12H  . mouth rinse  15 mL Mouth Rinse BID  . nicotine  21 mg Transdermal Daily     LOS: 5 days   Lonia Blood, MD [facility] Office  [phone] Pager - Text Page per Loretha Stapler as per  below:  On-Call/Text Page:      [email or web address]      password [email or web address]  If 7PM-7AM, please contact night-coverage [email or web address] Password [email or web address] 01/14/2018, 10:25 AM

## 2018-01-14 NOTE — Progress Notes (Signed)
Name: Stephanie Baldwin MRN:   [id number] DOB:   17-Mar-1961         ADMISSION DATE:  01/09/2018 CONSULTATION DATE:  01/09/18  REFERRING MD:  Laqueta JeanMohsin Arshad  CHIEF COMPLAINT:  Respiratory failure and severe hyponatremia  HISTORY OF PRESENT ILLNESS:   Stephanie Baldwin is a 57 y/o woman who presented [facility] with lethargy, weakness, confusion, and shortness of breath on 4/9 where she was found to be edematous and hyponatremic to 105. Prior to this she was noted to have nausea, vomiting, and very little PO intake for 2 weeks leading up to this admission. She was placed on Bipap for respiratory distress and also started on hypertonic saline for symptomatic hyponatremia. Over the next day sodium corrected rapidly to 124 but she developed worsened mental status and hypercapnea and hypoxia on ABG so was intubated. Due to critical condition and patient's family concerns about the level of care available [facility] as [facility] care [facility]. She arrived in a stable condition on the ventilator with her mother, sister, and niece present at bedside. Subjective Desaturated last night  Objective Blood Pressure 110/76   Pulse 87   Temperature 98.4 F (36.9 C) (Oral)   Respiration 17   Height 5\' 2"  (1.575 m) Comment: per family  Weight 232 lb 2.3 oz (105.3 kg)   Oxygen Saturation 99%   Body Mass Index 42.46 kg/m  CVP:  [8 mmHg] 8 mmHg     Intake/Output Summary (Last 24 hours) at 01/14/2018 1058 Last data filed at 01/14/2018 0900 Gross per 24 hour  Intake no documentation  Output 5035 ml  Net -5035 ml    Physical exam General: 57 year old white female currently resting in the chair.  She is in no acute distress. HEENT: Normocephalic atraumatic mucous membranes moist no jugular venous distention Pulmonary: Diminished in bases no accessory use Cardiac: Regular rate and rhythm Extremities: Warm and dry to decrease edema brisk cap refill strong  pulses Abdomen: Soft nontender no organomegaly Neuro: Awake alert no focal deficits CBC Recent Labs    01/14/18 0526  WBC 9.0  HGB 16.8*  HCT 53.9*  PLT 129*    Coag's No results for input(s): APTT, INR in the last 72 hours.  BMET Recent Labs    01/13/18 0256 01/13/18 0600 01/14/18 0526  NA 131* 133* 134*  K 3.7 3.7 3.6  CL 87* 86* 86*  CO2 33* 37* 38*  BUN 5* 5* 7  CREATININE 0.47 0.46 0.46  GLUCOSE 109* 111* 107*    Electrolytes Recent Labs    01/13/18 0256 01/13/18 0600 01/14/18 0526  CALCIUM 8.2* 8.7* 8.6*    Sepsis Markers No results for input(s): PROCALCITON, O2SATVEN in the last 72 hours.  Invalid input(s): LACTICACIDVEN  ABG No results for input(s): PHART, PCO2ART, PO2ART in the last 72 hours.  Liver Enzymes No results for input(s): AST, ALT, ALKPHOS, BILITOT, ALBUMIN in the last 72 hours.  Cardiac Enzymes No results for input(s): TROPONINI, PROBNP in the last 72 hours.  Glucose No results for input(s): GLUCAP in the last 72 hours.  Imaging No results found. Impression/plan Resolved issues: Leukocytosis Acute metabolic encephalopathy   Current issues: Hypervolemic hypoosmolar Hyponatremia (symptomatic) ? Liver disease vs heart failure ?   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  -course c/b rapid correction (105 to 124 in less than 24hrs)  Sodium level stable around 120.  She had D5 water this was discontinued on 4/14.  This  is continuing to normalize with diuresis Plan Follow-up a.m. chemistry 1 more day of IV Lasix then switch to p.o.  Acute hypercarbic and hypoxic respiratory failure  Probable undiagnosed OSA Chest x-ray personally reviewed on 4/13 demonstrates bilateral pulmonary edema and worsening basilar atelectasis -Clinically improving, she has diuresed over 11 L -Etiology of hypoxia, certainly volume overload complicated by atelectasis.  Suspect long-standing untreated obstructive sleep apnea Plan Continue  diuresis Follow-up a.m. chemistry X-ray a.m. ABG in a.m. AutoSet BiPAP at at bedtime Will ask case management to assist with setting up trilogy  Cardiomegaly  W/ probable diastolic dysfxn  -ECHO: EF 60-65%; LA mildly dilated RA mildly dilated.  -bnp not very impressive; given ECHO findings I wonder about chronic hypoxia and cor pulmonale  Plan Avoid hypoxia Continue pulse oximetry  Possible chronic liver disease  -abd [place]: 2. Coarse liver echotexture compatible with chronic liver disease. Possible hepatic steatosis. 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

## 2018-01-15 DIAGNOSIS — K760 Fatty (change of) liver, not elsewhere classified: Secondary | ICD-10-CM

## 2018-01-15 DIAGNOSIS — R0902 Hypoxemia: Secondary | ICD-10-CM

## 2018-01-15 DIAGNOSIS — K7460 Unspecified cirrhosis of liver: Secondary | ICD-10-CM

## 2018-01-15 LAB — CULTURE, BLOOD (ROUTINE X 2)
Culture: NO GROWTH
Culture: NO GROWTH
Special Requests: ADEQUATE

## 2018-01-15 LAB — CBC
HEMATOCRIT: 54 % — AB (ref 36.0–46.0)
HEMOGLOBIN: 17.3 g/dL — AB (ref 12.0–15.0)
MCH: 28.8 pg (ref 26.0–34.0)
MCHC: 32 g/dL (ref 30.0–36.0)
MCV: 89.9 fL (ref 78.0–100.0)
Platelets: 119 10*3/uL — ABNORMAL LOW (ref 150–400)
RBC: 6.01 MIL/uL — AB (ref 3.87–5.11)
RDW: 15.8 % — ABNORMAL HIGH (ref 11.5–15.5)
WBC: 10 10*3/uL (ref 4.0–10.5)

## 2018-01-15 LAB — BLOOD GAS, ARTERIAL
ACID-BASE EXCESS: 15.8 mmol/L — AB (ref 0.0–2.0)
BICARBONATE: 40.9 mmol/L — AB (ref 20.0–28.0)
Delivery systems: POSITIVE
Drawn by: 51831
Expiratory PAP: 15
O2 CONTENT: 15 L/min
O2 SAT: 90.7 %
PATIENT TEMPERATURE: 98.6
PH ART: 7.452 — AB (ref 7.350–7.450)
pCO2 arterial: 59.5 mmHg — ABNORMAL HIGH (ref 32.0–48.0)
pO2, Arterial: 62 mmHg — ABNORMAL LOW (ref 83.0–108.0)

## 2018-01-15 LAB — COMPREHENSIVE METABOLIC PANEL
ALBUMIN: 3.7 g/dL (ref 3.5–5.0)
ALK PHOS: 62 U/L (ref 38–126)
ALT: 25 U/L (ref 14–54)
ANION GAP: 12 (ref 5–15)
AST: 27 U/L (ref 15–41)
BILIRUBIN TOTAL: 1.7 mg/dL — AB (ref 0.3–1.2)
BUN: 10 mg/dL (ref 6–20)
CALCIUM: 8.8 mg/dL — AB (ref 8.9–10.3)
CO2: 35 mmol/L — ABNORMAL HIGH (ref 22–32)
Chloride: 87 mmol/L — ABNORMAL LOW (ref 101–111)
Creatinine, Ser: 0.6 mg/dL (ref 0.44–1.00)
GLUCOSE: 99 mg/dL (ref 65–99)
POTASSIUM: 3.5 mmol/L (ref 3.5–5.1)
Sodium: 134 mmol/L — ABNORMAL LOW (ref 135–145)
TOTAL PROTEIN: 6.6 g/dL (ref 6.5–8.1)

## 2018-01-15 LAB — CORTISOL: Cortisol, Plasma: 0.8 ug/dL

## 2018-01-15 MED ORDER — FUROSEMIDE 40 MG PO TABS
40.0000 mg | ORAL_TABLET | Freq: Two times a day (BID) | ORAL | Status: DC
  Administered 2018-01-15 – 2018-01-16 (×2): 40 mg via ORAL
  Filled 2018-01-15 (×2): qty 1

## 2018-01-15 MED ORDER — POTASSIUM CHLORIDE CRYS ER 20 MEQ PO TBCR
40.0000 meq | EXTENDED_RELEASE_TABLET | Freq: Two times a day (BID) | ORAL | Status: DC
  Administered 2018-01-15 – 2018-01-17 (×4): 40 meq via ORAL
  Filled 2018-01-15 (×4): qty 2

## 2018-01-15 MED ORDER — ENOXAPARIN SODIUM 60 MG/0.6ML ~~LOC~~ SOLN
50.0000 mg | SUBCUTANEOUS | Status: DC
  Administered 2018-01-15: 50 mg via SUBCUTANEOUS
  Filled 2018-01-15: qty 0.6

## 2018-01-15 MED ORDER — FUROSEMIDE 40 MG PO TABS: 40.0000 mg | ORAL_TABLET | Freq: Two times a day (BID) | ORAL | Status: DC

## 2018-01-15 NOTE — Progress Notes (Signed)
[place] TEAM 1 - Stepdown/ICU TEAM  Stevan Bornnn L Shaft  ZOX:[id number] DOB: October 15, 1960 DOA: 01/09/2018 PCP: Carin HockScatliffe, Kristen D, MD    Brief Narrative:  57 yo F who presented to [facility] with lethargy, weakness, confusion, and shortness of breath on 4/9 where she was found to be edematous and hyponatremic to 105. Prior to this she was noted to have nausea, vomiting, and very little PO intake for 2 weeks. She was placed on Bipap for respiratory distress and also started on hypertonic saline. Over the next day sodium corrected too rapidly to 124 and she developed worsened mental status w/ hypercapnea and hypoxia and was intubated. Due to family concerns she was transferred [facility] ICU. She arrived in a stable condition on the ventilator with her mother, sister, and niece present at bedside.  Significant Events: 4/10 admit 4/11 TTE - mild LVH, EF 60-65% - no WMA - LA mod dilated - RA mod dilated - no comment on DD 4/15 TRH assumed care   Subjective: The patient is sitting up at bedside.  She is alert oriented and quite jovial.  She tells me she feels like Anders SimmondsPete Babcock saved her life.  I have explained to her that in fact he did.  Assessment & Plan:  Acute metabolic encephalopathy > resolved Due initially to symptomatic hyponatremia, then complicated by sedation - cont to monitor for osmotic demyelination syndrome - clinically stabilizing   Hypervolemic hypoosmolar Hyponatremia ?liver disease vs ?heart failure - high risk for osmotic demyelination syndrome w/ rapid correction at OSH (105 to 124 in less than 24hrs) - Na slowly climbing to normal range at this time - follow trend   Recent Labs  Lab 01/12/18 1200 01/12/18 1703 01/13/18 0256 01/13/18 0600 01/14/18 0526 01/15/18 0406  NA 124* 128* 131* 133* 134* 134*    Acute hypercarbic and hypoxic respiratory failure - Probable undiagnosed OSA CXR 4/13 noted B pulmonary edema - improving w/ signif diuresis -  also suspect long-standing untreated obstructive sleep apnea - wean O2 as able - this issue is being addressed by PCCM who are in the process of arranging home Trilogy for d/c   Possible chronic liver disease  abd US noted coarse liver echotexture compatible with chronic liver disease - ?hepatic steatosis - no discrete liver lesion identified - lowered dose of APAP - viral hepatitis panel pending - will need outpt GI f/u, as well as signif weight loss   Probable adrenal insuff  Decadron begun by PCCM - slowly wean decadron - once fully cleared will need ACTH stim test - f/u cortisol this AM quite low, but suspect decadron is currently suppressing stimulation of adrenals   Thrombocytopenia Plt count > 100  Polycythemia  Evidence of suspected long standing hypoxic resp failure - follow Hgb w/ ongoing diuresis to assure blood-letting is not required   Obesity - Body mass index is 40.81 kg/m.   DVT prophylaxis: lovenox  Code Status: FULL CODE Family Communication: spoke w/ pt and Mother at bedside  Disposition Plan: SDU due to new BIPAP dependence   Consultants:  PCCM  Antimicrobials:  none  Objective: Blood pressure 117/72, pulse (!) 103, temperature 97.8 F (36.6 C), temperature source Oral, resp. rate (!) 21, height 5\' 2"  (1.575 m), weight 101.2 kg (223 lb 1.7 oz), SpO2 93 %.  Intake/Output Summary (Last 24 hours) at 01/15/2018 1132 Last data filed at 01/15/2018 0600 Gross per 24 hour  Intake -  Output 3225 ml  Net -3225 ml  Filed Weights   01/13/18 0500 01/14/18 0500 01/15/18 0405  Weight: 110.6 kg (243 lb 13.3 oz) 105.3 kg (232 lb 2.3 oz) 101.2 kg (223 lb 1.7 oz)    Examination: No acute resp distress - alert and oriented   CBC: Recent Labs  Lab 01/09/18 2250 01/14/18 0526 01/15/18 0406  WBC 12.8* 9.0 10.0  NEUTROABS 10.5*  --   --   HGB 15.5* 16.8* 17.3*  HCT 46.4* 53.9* 54.0*  MCV 86.1 91.2 89.9  PLT 145* 129* 119*   Basic Metabolic Panel: Recent  Labs  Lab 01/09/18 2250  01/13/18 0600 01/14/18 0526 01/15/18 0406  NA 123*   < > 133* 134* 134*  K 2.9*   < > 3.7 3.6 3.5  CL 79*   < > 86* 86* 87*  CO2 34*   < > 37* 38* 35*  GLUCOSE 104*   < > 111* 107* 99  BUN <5*   < > 5* 7 10  CREATININE 0.58   < > 0.46 0.46 0.60  CALCIUM 7.6*   < > 8.7* 8.6* 8.8*  MG 2.1  --   --   --   --   PHOS 3.9  --   --   --   --    < > = values in this interval not displayed.   GFR: Estimated Creatinine Clearance: 87.4 mL/min (by C-G formula based on SCr of 0.6 mg/dL).  Liver Function Tests: Recent Labs  Lab 01/09/18 2250 01/10/18 1019 01/15/18 0406  AST 27 27 27   ALT 20 18 25   ALKPHOS 60 63 62  BILITOT 1.7* 1.4* 1.7*  PROT 5.6* 5.5* 6.6  ALBUMIN 3.3* 3.1* 3.7   Coagulation Profile: Recent Labs  Lab 01/09/18 2250  INR 1.29    CBG: Recent Labs  Lab 01/09/18 2055 01/09/18 2326 01/10/18 0334 01/10/18 0734 01/10/18 1143  GLUCAP 145* 113* 94 90 90    Recent Results (from the past 240 hour(s))  MRSA PCR Screening     Status: None   Collection Time: 01/09/18  8:56 PM  Result Value Ref Range Status   MRSA by PCR NEGATIVE NEGATIVE Final    Comment:        The GeneXpert MRSA Assay (FDA approved for NASAL specimens only), is one component of a comprehensive MRSA colonization surveillance program. It is not intended to diagnose MRSA infection nor to guide or monitor treatment for MRSA infections. Performed at [facility], [address] [place], [place] [address]   Culture, blood (routine x 2)     Status: None (Preliminary result)   Collection Time: 01/09/18 11:16 PM  Result Value Ref Range Status   Specimen Description BLOOD LEFT HAND  Final   Special Requests   Final    BOTTLES DRAWN AEROBIC AND ANAEROBIC Blood Culture results may not be optimal due to an excessive volume of blood received in culture bottles   Culture   Final    NO GROWTH 4 DAYS Performed at [facility], [address] [place],  [place] [address]    Report Status PENDING  Incomplete  Culture, blood (routine x 2)     Status: None (Preliminary result)   Collection Time: 01/09/18 11:20 PM  Result Value Ref Range Status   Specimen Description BLOOD LEFT HAND  Final   Special Requests   Final    BOTTLES DRAWN AEROBIC AND ANAEROBIC Blood Culture adequate volume   Culture   Final  NO GROWTH 4 DAYS Performed at [facility], [address] [place], [place] [address]    Report Status PENDING  Incomplete     Scheduled Meds: . dexamethasone  2 mg Intravenous Daily  . enoxaparin (LOVENOX) injection  50 mg Subcutaneous Q24H  . furosemide  40 mg Oral BID  . mouth rinse  15 mL Mouth Rinse BID  . nicotine  21 mg Transdermal Daily  . potassium chloride  20 mEq Oral BID     LOS: 6 days   Lonia Blood, MD [facility] Office  [phone] Pager - Text Page per Amion as per below:  On-Call/Text Page:      [email or web address]      password [email or web address]  If 7PM-7AM, please contact night-coverage [email or web address] Password [email or web address] 01/15/2018, 11:32 AM

## 2018-01-15 NOTE — Progress Notes (Signed)
SATURATION QUALIFICATIONS: (This note is used to comply with regulatory documentation for home oxygen)  Patient Saturations on Room Air at Rest = 86%  Patient Saturations on Room Air while Ambulating = 74%  Patient Saturations on 4 Liters of oxygen while Ambulating = 91%  Please briefly explain why patient needs home oxygen: Patient's O2 sats decreased without oxygen.

## 2018-01-15 NOTE — Care Management Note (Addendum)
Case Management Note  Patient Details  Name: Stephanie Baldwin MRN: [id number] Date of Birth: [birth year]  Subjective/Objective:    Acute metabolic encephalopathy, acute hypercarbic and hypoxia resp failure                Action/Plan: NCM spoke to pt and lives in home with two dtrs, Stephanie Baldwin and Stephanie Baldwin. States she has not left her home in 3 years. Explained the importance of follow up with a PCP.  [facility] and [facility] is complete for DME NIV for home. [facility] will deliver trilogy and oxygen to room prior to dc. Will need qualify oxygen saturation. Notified Unit RN.    01/16/2018 NCM notified [facility] oxygen saturations and oxygen orders are in Epic.    Expected Discharge Date:                  Expected Discharge Plan:  Home/Self Care  In-House Referral:  NA  Discharge planning Services  CM Consult  Post Acute Care Choice:  NA Choice offered to:  NA  DME Arranged:  NIV DME Agency:  [facility]  HH Arranged:  NA HH Agency:  NA  Status of Service:  Completed, signed off  If discussed at Long Length of Stay Meetings, dates discussed:    Additional Comments:  Stephanie Baldwin, Stephanie Cordial Ellen, RN 01/15/2018, 3:32 PM

## 2018-01-15 NOTE — Progress Notes (Addendum)
Name: CHRISSI CROW MRN:   [id number] DOB:   21-Dec-1960         ADMISSION DATE:  01/09/2018 CONSULTATION DATE:  01/09/18  REFERRING MD:  Laqueta Jean  CHIEF COMPLAINT:  Respiratory failure and severe hyponatremia  HISTORY OF PRESENT ILLNESS:   Rue Valladares is a 57 y/o woman who presented to [facility] with lethargy, weakness, confusion, and shortness of breath on 4/9 where she was found to be edematous and hyponatremic to 105. Prior to this she was noted to have nausea, vomiting, and very little PO intake for 2 weeks leading up to this admission. She was placed on Bipap for respiratory distress and also started on hypertonic saline for symptomatic hyponatremia. Over the next day sodium corrected rapidly to 124 but she developed worsened mental status and hypercapnea and hypoxia on ABG so was intubated. Due to critical condition and patient's family concerns about the level of care available she was transferred to [facility] ICU as a tertiary care site. She arrived in a stable condition on the ventilator with her mother, sister, and niece present at bedside. Subjective Continues to feel better day by day  Objective Blood Pressure 117/68   Pulse 79   Temperature 97.8 F (36.6 C) (Oral)   Respiration 18   Height 5\' 2"  (1.575 m) Comment: per family  Weight 223 lb 1.7 oz (101.2 kg)   Oxygen Saturation 95%   Body Mass Index 40.81 kg/m        Intake/Output Summary (Last 24 hours) at 01/15/2018 0915 Last data filed at 01/15/2018 0600 Gross per 24 hour  Intake no documentation  Output 4225 ml  Net -4225 ml    Physical exam General: This is a 57 year old white female resting comfortably in bed.  She looks better day by day HEENT: Normocephalic atraumatic no jugular venous distention Pulmonary: Diminished in the bases no accessory use. Abdomen soft nontender Cardiac: Regular rate and rhythm Extremities: Brisk cap refill no edema warm, strong pulses Neuro: Awake  oriented no focal deficits  CBC Recent Labs    01/14/18 0526 01/15/18 0406  WBC 9.0 10.0  HGB 16.8* 17.3*  HCT 53.9* 54.0*  PLT 129* 119*    Coag's No results for input(s): APTT, INR in the last 72 hours.  BMET Recent Labs    01/13/18 0600 01/14/18 0526 01/15/18 0406  NA 133* 134* 134*  K 3.7 3.6 3.5  CL 86* 86* 87*  CO2 37* 38* 35*  BUN 5* 7 10  CREATININE 0.46 0.46 0.60  GLUCOSE 111* 107* 99    Electrolytes Recent Labs    01/13/18 0600 01/14/18 0526 01/15/18 0406  CALCIUM 8.7* 8.6* 8.8*    Sepsis Markers No results for input(s): PROCALCITON, O2SATVEN in the last 72 hours.  Invalid input(s): LACTICACIDVEN  ABG Recent Labs    01/15/18 0425  PHART 7.452*  PCO2ART 59.5*  PO2ART 62.0*    Liver Enzymes Recent Labs    01/15/18 0406  AST 27  ALT 25  ALKPHOS 62  BILITOT 1.7*  ALBUMIN 3.7    Cardiac Enzymes No results for input(s): TROPONINI, PROBNP in the last 72 hours.  Glucose No results for input(s): GLUCAP in the last 72 hours.  Imaging No results found. Impression/plan Resolved issues: Leukocytosis Acute metabolic encephalopathy   Current issues: Hypervolemic hypoosmolar Hyponatremia (symptomatic) ? Liver disease vs heart failure ?   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  -course c/b rapid correction (105 to 124 in less than 24hrs)  Sodium level stable around 120.  She had D5 water this was discontinued on 4/14.  This is continuing to normalize with diuresis Plan Switching to oral Lasix  Acute hypercarbic and hypoxic respiratory failure  Probable undiagnosed OSA and severe COPD  Chest x-ray personally reviewed on 4/13 demonstrates bilateral pulmonary edema and worsening basilar atelectasis -Clinically improving, she has diuresed over 11 L -Etiology of hypoxia, certainly volume overload complicated by atelectasis.  Suspect long-standing untreated obstructive sleep apnea and severe COPD. She continues to exhibit signs of  hypercapnia associated with her chronic respiratory failure due to chronic obstructive pulmonary disease and hypoventilation.  She requires use of noninvasive ventilation both at hour of sleep as well as needed during the day.  The use of noninvasive ventilation will treat the patient's high PCO2 level and therefore reduce her risk of exacerbations and further hospitalization when used at night and as needed during the day.  She will need advanced setting in conjunction with her medication regimen.  BiPAP is not an option due to functional limitations and the severity of her condition.  Failure to not have noninvasive ventilation for use over 24-hour could lead to death Plan Switching to oral diuretics BiPAP at night, BiPAP set at 20 EPAP at 8.  5 L oxygen to be bled in.  Will ask for backup rate of 12. Walking oximetry Will make her follow-up in our clinic  Cardiomegaly  W/ probable diastolic dysfxn  -ECHO: EF 60-65%; LA mildly dilated RA mildly dilated.  -bnp not very impressive; given ECHO findings I wonder about chronic hypoxia and cor pulmonale  -We have diuresed 48 pounds of fluid from her Plan Avoid hypoxia Will ask cardiology to see her, would be advantageous for her to have outpatient follow-up in the heart failure clinic  Possible chronic liver disease  -abd US: 2. Coarse liver echotexture compatible with chronic liver disease. Possible hepatic steatosis. No discrete liver lesion identified Plan Needs GI eval and outpatient  Probable adrenal insuff  Plan Continuing Decadron for now   Thrombocytopenia Plan Trend CBC  Simonne MartinetPeter E Babcock ACNP-BC [facility] Pulmonary/Critical Care Pager # [phone] OR # [phone] if no answer  Attending Note:  57 year old morbidly obese female who presents to PCCM with acute on chronic respiratory failure.  On exam, decrease BS at the bases.  I reviewed CXR myself, bibasilar atelectasis noted.  Discussed with case management, BiPAP is now available  after note above as outpatient.  Will need OP f/u upon discharge.  Discussed with PCCM-NP.  Chronic respiratory failure with hypercarbia:  - BiPAP as above  - Bleed O2 into the patient's BiPAP at night for target sat of 88-92%  Hypoxemia:  - Titrate O2 during the day for sat of 88-92%  Acute pulmonary edema:  - Lasix as above  - Heart failure clinic referal  Hepatic congestion: due to heart failure  - Could benefit from GI input  PCCM will sign off, please call back if needed.  Patient seen and examined, agree with above note.  I dictated the care and orders written for this patient under my direction.  Alyson ReedyYacoub, Wesam G, MD [phone]

## 2018-01-16 ENCOUNTER — Encounter (HOSPITAL_COMMUNITY): Payer: Self-pay | Admitting: Family Medicine

## 2018-01-16 DIAGNOSIS — D696 Thrombocytopenia, unspecified: Secondary | ICD-10-CM

## 2018-01-16 DIAGNOSIS — E2740 Unspecified adrenocortical insufficiency: Secondary | ICD-10-CM

## 2018-01-16 DIAGNOSIS — G4733 Obstructive sleep apnea (adult) (pediatric): Secondary | ICD-10-CM

## 2018-01-16 DIAGNOSIS — F17210 Nicotine dependence, cigarettes, uncomplicated: Secondary | ICD-10-CM

## 2018-01-16 DIAGNOSIS — J810 Acute pulmonary edema: Secondary | ICD-10-CM

## 2018-01-16 DIAGNOSIS — E66813 Obesity, class 3: Secondary | ICD-10-CM

## 2018-01-16 HISTORY — DX: Obstructive sleep apnea (adult) (pediatric): G47.33

## 2018-01-16 HISTORY — DX: Obesity, class 3: E66.813

## 2018-01-16 HISTORY — DX: Morbid (severe) obesity due to excess calories: E66.01

## 2018-01-16 HISTORY — DX: Unspecified adrenocortical insufficiency: E27.40

## 2018-01-16 LAB — HEPATITIS PANEL, ACUTE
HEP A IGM: NEGATIVE
Hep B C IgM: NEGATIVE
Hepatitis B Surface Ag: NEGATIVE

## 2018-01-16 LAB — CBC
HCT: 55.6 % — ABNORMAL HIGH (ref 36.0–46.0)
Hemoglobin: 18 g/dL — ABNORMAL HIGH (ref 12.0–15.0)
MCH: 29.2 pg (ref 26.0–34.0)
MCHC: 32.4 g/dL (ref 30.0–36.0)
MCV: 90.1 fL (ref 78.0–100.0)
PLATELETS: 107 10*3/uL — AB (ref 150–400)
RBC: 6.17 MIL/uL — ABNORMAL HIGH (ref 3.87–5.11)
RDW: 15.9 % — ABNORMAL HIGH (ref 11.5–15.5)
WBC: 9.2 10*3/uL (ref 4.0–10.5)

## 2018-01-16 LAB — BASIC METABOLIC PANEL
Anion gap: 8 (ref 5–15)
BUN: 13 mg/dL (ref 6–20)
CALCIUM: 9.3 mg/dL (ref 8.9–10.3)
CO2: 35 mmol/L — ABNORMAL HIGH (ref 22–32)
CREATININE: 0.56 mg/dL (ref 0.44–1.00)
Chloride: 89 mmol/L — ABNORMAL LOW (ref 101–111)
GFR calc non Af Amer: >60 mL/min (ref >60)
GLUCOSE: 121 mg/dL — AB (ref 65–99)
Potassium: 4.2 mmol/L (ref 3.5–5.1)
Sodium: 132 mmol/L — ABNORMAL LOW (ref 135–145)

## 2018-01-16 MED ORDER — DEXAMETHASONE 4 MG PO TABS
2.0000 mg | ORAL_TABLET | Freq: Every day | ORAL | Status: DC
  Administered 2018-01-17: 2 mg via ORAL
  Filled 2018-01-16: qty 1

## 2018-01-16 MED ORDER — FUROSEMIDE 40 MG PO TABS
40.0000 mg | ORAL_TABLET | Freq: Every day | ORAL | Status: DC
  Administered 2018-01-17: 40 mg via ORAL
  Filled 2018-01-16: qty 1

## 2018-01-16 NOTE — Progress Notes (Signed)
PROGRESS NOTE  Stephanie Baldwin ZOX:[id number] DOB: 08-14-1961 DOA: 01/09/2018 PCP: Carin Hock, MD  Brief Narrative: 57 year old woman presented to [facility] with shortness of breath.  Found to have hyponatremia sodium 105.  Rapidly corrected to 124 with hypertonic saline.  Developed acute encephalopathy, hypercapnia was intubated, then transferred to [facility] for critical care evaluation.  History of poor oral intake for 2 weeks prior to admission.  Initial concern for CPM.  Respiratory status improved rapidly and the patient was successfully extubated but required BiPAP overnight.  Acute metabolic encephalopathy rapidly resolved.  Hyponatremia treated with desmopressin and fluids initially then with aggressive diuresis, over 11 L.  Oxygen requirements remained highAnd continue to exhibit signs of hypercapnia associated with chronic respiratory failure.  PCCM recommended BiPAP at home.  Assessment/Plan Acute mixed hypercapnic, hypoxic respiratory failure with associated acute encephalopathy.  Acute encephalopathy resolved.  Suspected underlying obstructive sleep apnea, some degree of obesity hypoventilation syndrome, COPD. --Doing well.  Follow-up with pulmonology/sleep medicine as an outpatient. --BiPAP per PCCM: BiPAP at night, BiPAP set at 20 EPAP at 8.  5 L oxygen to be bled in.  Backup rate of 12. Goal target saturated station 88-92%  Hyponatremia, etiology unclear.  Possibly related to liver disease or heart failure.  High risk for osmotic demyelination syndrome, which can occur delayed onset, however there is no evidence to suggest this complication at this point.  --Sodium now stable, borderline low.  Chloride trending up.  Expect spontaneous resolution.  Obstructive sleep apnea.  Given echo findings PCCM concern for chronic hypoxia, cor pulmonale. --Appears stable.  BiPAP at night.  Follow-up with pulmonology/sleep medicine as an outpatient.  Acute pulmonary  edema secondary to IV fluids, possible acute diastolic heart failure.  Echocardiogram with normal LVEF.  Possible diastolic failure. --Outpatient follow-up with heart failure clinic has been arranged.  Possible chronic liver disease.  Abdominal ultrasound showed coarse liver echo texture compatible with chronic liver disease.  Possible hepatic steatosis.  Hepatitis panel negative. --Outpatient GI evaluation suggested.  Adrenal insufficiency.  Random cortisol low at 1.9.  Started on Decadron 4/12. --Wean Decadron, change to p.o. --Will need outpatient cortisol stim test.  Thrombocytopenia --Trending down.  Stop enoxaparin.  Repeat CBC in a.m.  Polycythemia likely secondary secondary to long-standing hypoxia. --Follow-up as an outpatient  Cigarette smoker --Continue nicotine patch.  Recommend cessation  Agoraphobia, depression --Appears stable.  Morbid obesity --Recommend weight loss   Appears to be improving.  Recheck CBC in the morning.  If platelets stable and respiratory status stable, anticipate discharge home.  DVT prophylaxis: SCDs Code Status: full Family Communication: mother at bedside Disposition Plan: home    Brendia Sacks, MD  [facility] Direct contact: [phone] --Via amion app OR  --[email or web address]; password [email or web address]  7PM-7AM contact night coverage as above 01/16/2018, 10:09 AM  LOS: 7 days   Consultants:  Admitted by critical care  Procedures:  Reported PICC line placement 4/9 at outside facility.  ETT on admission, extubated 4/11  Echo Study Conclusions  - Left ventricle: The cavity size was normal. Wall thickness was   increased in a pattern of mild LVH. Systolic function was normal.   The estimated ejection fraction was in the range of 60% to 65%.   Although no diagnostic regional wall motion abnormality was   identified, this possibility cannot be completely excluded on the   basis of this study. - Left atrium: The atrium was  mildly to moderately dilated. - Right atrium: The atrium  was moderately dilated.  Antimicrobials:    Interval history/Subjective: Feels much better.  Breathing better.  Ambulating.  Eating well.  Has not used BiPAP during the day for several days.  Objective: Vitals:  Vitals:   01/16/18 0607 01/16/18 0813  BP: (!) 141/94 126/88  Pulse:  87  Resp: 16 (!) 21  Temp: 97.7 F (36.5 C) 98 F (36.7 C)  SpO2: 96% 93%    Exam:  Constitutional:  . Appears calm and comfortable sitting on the side of the bed. Eyes:  . pupils and irises appear normal . Normal lids  ENMT:  . grossly normal hearing  . Lips appear normal Respiratory:  . CTA bilaterally, no w/r/r.  . Respiratory effort normal Cardiovascular:  . RRR, no m/r/g . No LE extremity edema   Psychiatric:  . Mental status o Mood, affect appropriate . judgement and insight appear normal   I have personally reviewed the following:  Filed Weights   01/14/18 0500 01/15/18 0405 01/16/18 0630  Weight: 105.3 kg (232 lb 2.3 oz) 101.2 kg (223 lb 1.7 oz) 105.2 kg (231 lb 14.8 oz)   Weight change: 4 kg (8 lb 13.1 oz)  UOP: -1250 I/O since admission: -23.3 L  Labs:  Sodium 132, chloride 89, CO2 35.  BUN and creatinine within normal limits.  Hemoglobin stable, 18.0.  WBC within normal limits.  Platelets trending down, 107  Scheduled Meds: . [START ON 01/17/2018] dexamethasone  2 mg Oral Daily  . [START ON 01/17/2018] furosemide  40 mg Oral Daily  . mouth rinse  15 mL Mouth Rinse BID  . nicotine  21 mg Transdermal Daily  . potassium chloride  40 mEq Oral BID   Continuous Infusions:  Principal Problem:   Acute respiratory failure with hypoxia and hypercapnia (HCC) Active Problems:   Hyponatremia   OSA (obstructive sleep apnea)   Acute pulmonary edema (HCC)   Adrenal insufficiency (HCC)   Thrombocytopenia (HCC)   Cigarette smoker   Obesity, Class III, BMI 40-49.9 (morbid obesity) (HCC)   LOS: 7 days

## 2018-01-16 NOTE — Progress Notes (Signed)
Patient placed on BIPAP per documented settings, tolerating well, RCP will continue to monitor.

## 2018-01-16 NOTE — Progress Notes (Signed)
SATURATION QUALIFICATIONS: (This note is used to comply with regulatory documentation for home oxygen)  Patient Saturations on Room Air at Rest = 87%  Patient Saturations on Room Air while Ambulating = 84%  Patient Saturations on 4 Liters of oxygen while Ambulating = 90%  Please briefly explain why patient needs home oxygen:Pt requires O2 to keep sats >90%.   [facility] Keyonni Percival,PT Acute Rehabilitation [phone] (pager)

## 2018-01-16 NOTE — Evaluation (Signed)
Physical Therapy Evaluation Patient Details Name: Stephanie Baldwin MRN: [id number] DOB: 09-02-1961 Today's Date: 01/16/2018   History of Present Illness  57 year old woman presented to [facility] with shortness of breath.  Found to have hyponatremia sodium 105.  Rapidly corrected to 124 with hypertonic saline.  Developed acute encephalopathy, hypercapnia was intubated, then transferred to [facility] for critical care evaluation.  History of poor oral intake for 2 weeks prior to admission.  Initial concern for CPM.  Respiratory status improved rapidly and the patient was successfully extubated but required BiPAP overnight.  Acute metabolic encephalopathy rapidly resolved.   Clinical Impression  Pt admitted with above diagnosis. Pt currently with functional limitations due to the deficits listed below (see PT Problem List). Pt was able to ambulate with rollator with good steady gait and demonstrates good safety locking brakes tto sit and rest prn.  Pt desats as below and will need home O2.  Would benefit from getting equipment for home.  Mother present and both pt and mom appreciative of PT and recommendations.  Energy conservation techniques discussed as well.    Pt will benefit from skilled PT to increase their independence and safety with mobility to allow discharge to the venue listed below.    SATURATION QUALIFICATIONS: (This note is used to comply with regulatory documentation for home oxygen)  Patient Saturations on Room Air at Rest = 87%  Patient Saturations on Room Air while Ambulating = 84%  Patient Saturations on 4 Liters of oxygen while Ambulating = 90%  Please briefly explain why patient needs home oxygen:Pt requires O2 to keep sats >90%.     Follow Up Recommendations No PT follow up;Supervision - Intermittent(Medicaid will not cover therapy)    Equipment Recommendations  3in1 vs tub seat (see OT recommendation regarding this) (Needs rollator and O2 with pt requesting  portable O2 concentrator)    Recommendations for Other Services       Precautions / Restrictions Precautions Precautions: Fall Restrictions Weight Bearing Restrictions: No      Mobility  Bed Mobility Overal bed mobility: Independent                Transfers Overall transfer level: Needs assistance   Transfers: Sit to/from Stand Sit to Stand: Supervision            Ambulation/Gait Ambulation/Gait assistance: Min guard Ambulation Distance (Feet): 280 Feet Assistive device: 4-wheeled walker Gait Pattern/deviations: Step-through pattern;Decreased stride length;Trunk flexed   Gait velocity interpretation: 1.31 - 2.62 ft/sec, indicative of limited community ambulator General Gait Details: Pt was able to ambulate some without rollator but definitely more steady and more sure of herself with the rollator.  Pt desat on RA at rest to 89%.  Pt desat to 85% when walking  needing 4L to keep sats >90%.  Other VSS.    Stairs            Wheelchair Mobility    Modified Rankin (Stroke Patients Only)       Balance Overall balance assessment: Needs assistance Sitting-balance support: No upper extremity supported;Feet supported Sitting balance-Leahy Scale: Fair     Standing balance support: No upper extremity supported;During functional activity Standing balance-Leahy Scale: Fair Standing balance comment: can stand statically without device but guarded.                              Pertinent Vitals/Pain Pain Assessment: No/denies pain    Home Living Family/patient expects  to be discharged to:: Private residence Living Arrangements: Children Available Help at Discharge: Family;Available 24 hours/day Type of Home: House Home Access: Stairs to enter Entrance Stairs-Rails: Right;Left;Can reach both Entrance Stairs-Number of Steps: 6 Home Layout: One level Home Equipment: Environmental consultantWalker - 2 wheels      Prior Function Level of Independence: Independent                Hand Dominance        Extremity/Trunk Assessment   Upper Extremity Assessment Upper Extremity Assessment: Defer to OT evaluation    Lower Extremity Assessment Lower Extremity Assessment: Generalized weakness    Cervical / Trunk Assessment Cervical / Trunk Assessment: Normal  Communication   Communication: No difficulties  Cognition Arousal/Alertness: Awake/alert Behavior During Therapy: WFL for tasks assessed/performed Overall Cognitive Status: Within Functional Limits for tasks assessed                                        General Comments General comments (skin integrity, edema, etc.): OT to tae energy conservation handout to pt. Discussed techniques with pt.     Exercises     Assessment/Plan    PT Assessment Patient needs continued PT services  PT Problem List Decreased balance;Decreased mobility;Decreased knowledge of use of DME;Decreased safety awareness;Decreased knowledge of precautions;Cardiopulmonary status limiting activity;Decreased activity tolerance       PT Treatment Interventions DME instruction;Gait training;Stair training;Functional mobility training;Therapeutic activities;Therapeutic exercise;Balance training;Patient/family education    PT Goals (Current goals can be found in the Care Plan section)  Acute Rehab PT Goals Patient Stated Goal: to gohome PT Goal Formulation: With patient Time For Goal Achievement: 01/30/18 Potential to Achieve Goals: Good    Frequency Min 3X/week   Barriers to discharge        Co-evaluation               AM-PAC PT "6 Clicks" Daily Activity  Outcome Measure Difficulty turning over in bed (including adjusting bedclothes, sheets and blankets)?: None Difficulty moving from lying on back to sitting on the side of the bed? : None Difficulty sitting down on and standing up from a chair with arms (e.g., wheelchair, bedside commode, etc,.)?: None Help needed moving to and from a  bed to chair (including a wheelchair)?: A Little Help needed walking in hospital room?: A Little Help needed climbing 3-5 steps with a railing? : Total 6 Click Score: 19    End of Session Equipment Utilized During Treatment: Gait belt;Oxygen Activity Tolerance: Patient limited by fatigue Patient left: in bed;with call bell/phone within reach;with family/visitor present(sitting EOB) Nurse Communication: Mobility status PT Visit Diagnosis: Unsteadiness on feet (R26.81);Muscle weakness (generalized) (M62.81)    Time: [id number] PT Time Calculation (min) (ACUTE ONLY): 29 min   Charges:   PT Evaluation $PT Eval Moderate Complexity: 1 Mod PT Treatments $Gait Training: 8-22 mins   PT G Codes:        Colonel Krauser,PT Acute Rehabilitation [phone] (pager)   Stephanie Baldwin 01/16/2018, 2:01 PM

## 2018-01-16 NOTE — Evaluation (Signed)
Occupational Therapy Evaluation Patient Details Name: Stephanie Baldwin MRN: [id number] DOB: 03/07/1961 Today's Date: 01/16/2018    History of Present Illness 57 year old woman [facility].  Found to have hyponatremia sodium 105.  Rapidly corrected to 124 with hypertonic saline.  Developed acute encephalopathy, hypercapnia was intubated, then [facility] care [facility].  History of poor oral intake for 2 weeks prior to admission.  Initial concern for CPM.  Respiratory status improved rapidly and the patient was successfully extubated but required BiPAP overnight.  Acute metabolic encephalopathy rapidly resolved.    Clinical Impression   Pt is typically independent. She is currently functioning at a supervision level in ADL with DME. Educated in energy conservation strategies and reinforced with handout. Instructed in pursed lip breathing techniques. Pt has a supportive mother who can assist as needed at home. Will follow acutely. Do not anticipate pt will need post acute OT.    Follow Up Recommendations  No OT follow up    Equipment Recommendations  Tub/shower seat(Rollator)    Recommendations for Other Services       Precautions / Restrictions Precautions Precautions: Fall Restrictions Weight Bearing Restrictions: No      Mobility Bed Mobility Overal bed mobility: Independent             General bed mobility comments: HOB flat  Transfers Overall transfer level: Needs assistance Equipment used: Rolling walker (2 wheeled) Transfers: Sit to/from Stand Sit to Stand: Supervision              Balance Overall balance assessment: Needs assistance Sitting-balance support: No upper extremity supported;Feet supported Sitting balance-Leahy Scale: Good Sitting balance - Comments: no LOB donning socks   Standing balance support: No upper extremity supported;During functional activity Standing  balance-Leahy Scale: Fair Standing balance comment: can stand statically without device at sink, needs RW for ambulation                           ADL either performed or assessed with clinical judgement   ADL Overall ADL's : Needs assistance/impaired Eating/Feeding: Independent;Sitting   Grooming: Wash/dry hands;Standing;Supervision/safety   Upper Body Bathing: Set up;Sitting   Lower Body Bathing: Supervison/ safety;Sit to/from stand   Upper Body Dressing : Set up;Sitting   Lower Body Dressing: Supervision/safety;Sit to/from stand   Toilet Transfer: Supervision/safety;Ambulation;RW;Regular Toilet   Toileting- ArchitectClothing Manipulation and Hygiene: Supervision/safety;Sit to/from stand       Functional mobility during ADLs: Supervision/safety;Rolling walker General ADL Comments: Educated in energy conservation strategies and gave handout.     Vision Patient Visual Report: No change from baseline       Perception     Praxis      Pertinent Vitals/Pain Pain Assessment: No/denies pain     Hand Dominance Right   Extremity/Trunk Assessment Upper Extremity Assessment Upper Extremity Assessment: Overall WFL for tasks assessed   Lower Extremity Assessment Lower Extremity Assessment: Defer to PT evaluation   Cervical / Trunk Assessment Cervical / Trunk Assessment: Normal   Communication Communication Communication: No difficulties   Cognition Arousal/Alertness: Awake/alert Behavior During Therapy: WFL for tasks assessed/performed Overall Cognitive Status: Within Functional Limits for tasks assessed                                     General Comments  OT to tae energy conservation handout  to pt. Discussed techniques with pt.     Exercises     Shoulder Instructions      Home Living Family/patient expects to be discharged to:: Private residence Living Arrangements: Children Available Help at Discharge: Family;Available 24  hours/day Type of Home: House Home Access: Stairs to enter [employer] of Steps: 6 Entrance Stairs-Rails: Right;Left;Can reach both Home Layout: One level     Bathroom Shower/Tub: Chief Strategy Officer: Standard     Home Equipment: Environmental consultant - 2 wheels          Prior Functioning/Environment Level of Independence: Independent                 OT Problem List: Decreased activity tolerance;Impaired balance (sitting and/or standing);Decreased knowledge of use of DME or AE;Cardiopulmonary status limiting activity      OT Treatment/Interventions: Self-care/ADL training;DME and/or AE instruction;Energy conservation;Patient/family education;Balance training;Therapeutic activities    OT Goals(Current goals can be found in the care plan section) Acute Rehab OT Goals Patient Stated Goal: to gohome OT Goal Formulation: With patient Time For Goal Achievement: 01/30/18 Potential to Achieve Goals: Good ADL Goals Pt Will Perform Grooming: standing;with modified independence Pt Will Perform Lower Body Bathing: with modified independence;sit to/from stand Pt Will Perform Lower Body Dressing: with modified independence;sit to/from stand Pt Will Transfer to Toilet: with modified independence;ambulating;regular height toilet Pt Will Perform Toileting - Clothing Manipulation and hygiene: with modified independence;sit to/from stand Pt Will Perform Tub/Shower Transfer: Tub transfer;with supervision;ambulating;shower seat;rolling walker Additional ADL Goal #1: Pt will state at least 3 energy conservation strategies as educated.  OT Frequency: Min 2X/week   Barriers to D/C:            Co-evaluation              AM-PAC PT "6 Clicks" Daily Activity     Outcome Measure Help from another person eating meals?: None Help from another person taking care of personal grooming?: A Little Help from another person toileting, which includes using toliet, bedpan, or  urinal?: A Little Help from another person bathing (including washing, rinsing, drying)?: A Little Help from another person to put on and taking off regular upper body clothing?: None Help from another person to put on and taking off regular lower body clothing?: A Little 6 Click Score: 20   End of Session Equipment Utilized During Treatment: Rolling walker;Gait belt;Oxygen(4L)  Activity Tolerance: Patient tolerated treatment well Patient left: in bed;with call bell/phone within reach;with family/visitor present  OT Visit Diagnosis: Unsteadiness on feet (R26.81)                Time: 1610-9604 OT Time Calculation (min): 19 min Charges:  OT General Charges $OT Visit: 1 Visit OT Evaluation $OT Eval Moderate Complexity: 1 Mod G-Codes:     01-30-2018 Martie Round, OTR/L Pager: [phone]  Iran Planas, [place] Bailiff 2018-01-30, 2:23 PM

## 2018-01-17 ENCOUNTER — Encounter: Payer: Self-pay | Admitting: Gastroenterology

## 2018-01-17 DIAGNOSIS — F17210 Nicotine dependence, cigarettes, uncomplicated: Secondary | ICD-10-CM

## 2018-01-17 LAB — CBC
HCT: 55.4 % — ABNORMAL HIGH (ref 36.0–46.0)
HEMOGLOBIN: 17.5 g/dL — AB (ref 12.0–15.0)
MCH: 28.7 pg (ref 26.0–34.0)
MCHC: 31.6 g/dL (ref 30.0–36.0)
MCV: 90.8 fL (ref 78.0–100.0)
Platelets: 117 10*3/uL — ABNORMAL LOW (ref 150–400)
RBC: 6.1 MIL/uL — AB (ref 3.87–5.11)
RDW: 15.7 % — ABNORMAL HIGH (ref 11.5–15.5)
WBC: 11.2 10*3/uL — ABNORMAL HIGH (ref 4.0–10.5)

## 2018-01-17 MED ORDER — ALBUTEROL SULFATE HFA 108 (90 BASE) MCG/ACT IN AERS: 2.0000 | INHALATION_SPRAY | Freq: Four times a day (QID) | RESPIRATORY_TRACT | 0 refills | Status: DC | PRN

## 2018-01-17 MED ORDER — NICOTINE 14 MG/24HR TD PT24: 14.0000 mg | MEDICATED_PATCH | TRANSDERMAL | 0 refills | Status: DC

## 2018-01-17 MED ORDER — DEXAMETHASONE 1 MG PO TABS: ORAL_TABLET | ORAL | 0 refills | Status: AC

## 2018-01-17 MED ORDER — FUROSEMIDE 20 MG PO TABS: 20.0000 mg | ORAL_TABLET | Freq: Every day | ORAL | 0 refills | Status: DC

## 2018-01-17 MED ORDER — POTASSIUM CHLORIDE CRYS ER 20 MEQ PO TBCR: 20.0000 meq | EXTENDED_RELEASE_TABLET | Freq: Every day | ORAL | 0 refills | Status: DC

## 2018-01-17 NOTE — Discharge Summary (Signed)
Physician Discharge Summary  Stephanie Baldwin:[id number] DOB: 12-07-1960 DOA: 01/09/2018  PCP: Carin Hock, MD  Admit date: 01/09/2018 Discharge date: 01/17/2018  Recommendations for Outpatient Follow-up:  Acute mixed hypercapnic, hypoxic respiratory failure with associated acute encephalopathy.  Acute encephalopathy resolved.  Suspected underlying obstructive sleep apnea, some degree of obesity hypoventilation syndrome, COPD. --Continues to do well.  Follow-up with pulmonology/sleep medicine as an outpatient has been arranged. --BiPAP per PCCM: BiPAP at night, BiPAP set at 20 EPAP at 8. 5 L oxygen to be bled in. Backup rate of 12. Goal target saturated station 88-92%   Acute pulmonary edema secondary to IV fluids, possible acute diastolic heart failure.  --Outpatient follow-up with cardiology clinic in [facility] per patient request has been arranged.  Possible chronic liver disease. Outpatient GI evaluation has been arranged with [place] [facility].  Adrenal insufficiency.  Random cortisol low at 1.9.  Decadron taper. Consider outpatient cortisol stim test.  Thrombocytopenia --Stable.  Secondary to acute illness.    Polycythemia likely secondary to long-standing hypoxia.  Cigarette smoker --Recommend cessation   Follow-up Information    [facility] Follow [facility].   Specialty:  Home Health Services Why:  For [facility] and equipment Contact information: [address] [name] [address] [phone]        [name], MD Follow up on 01/22/2018.   Specialty:  Pulmonary Disease Why:  10:00 AM Contact information: [address] [place] [address] [phone]        [name], [name], MD Follow up on 01/31/2018.   Specialty:  Cardiology Why:  10:20 AM Contact information: [address] [place]  [place] [address] [phone]        [name] Follow up on 02/27/2018.   Why:  1:45 PM Contact  information: [address] [place], [address]           Discharge Diagnoses:  Acute mixed hypercapnic, hypoxic respiratory failure with associated acute encephalopathy.  Acute encephalopathy resolved.  Suspected underlying obstructive sleep apnea, some degree of obesity hypoventilation syndrome, COPD. --Continues to do well.  Follow-up with pulmonology/sleep medicine as an outpatient has been arranged. --BiPAP per PCCM: BiPAP at night, BiPAP set at 20 EPAP at 8. 5 L oxygen to be bled in. Backup rate of 12. Goal target saturated station 88-92%  Hyponatremia, etiology unclear.  Possibly related to liver disease or heart failure. High risk for osmotic demyelination syndrome, which can occur delayed onset, however there is no evidence to suggest this complication at this point.  --Sodium now stable, borderline low.  Chloride trending up.    Expect spontaneous resolution  Obstructive sleep apnea.  Given echo findings PCCM concern for chronic hypoxia, cor pulmonale. --Appears stable.  Trilogy at night.  Follow-up with pulmonology/sleep medicine as an outpatient.  Acute pulmonary edema secondary to IV fluids, possible acute diastolic heart failure.  Echocardiogram with normal LVEF.  Possible diastolic failure. --Outpatient follow-up with cardiology clinic in [facility] per patient request has been arranged.  Possible chronic liver disease.  Abdominal ultrasound showed coarse liver echo texture compatible with chronic liver disease.  Possible hepatic steatosis.  Hepatitis panel negative. --Outpatient GI evaluation has been arranged with [place] [facility].  Adrenal insufficiency.  Random cortisol low at 1.9.  Started on Decadron 4/12. --Wean Decadron --Will need outpatient cortisol stim test.  Thrombocytopenia --Stable.  Secondary to acute illness.  Follow-up as an outpatient.  Polycythemia likely  secondary to long-standing hypoxia. --Follow-up as an  outpatient.  Cigarette smoker --Continue nicotine patch.  Recommend cessation  Agoraphobia, depression --Appears stable.  Morbid obesity --weight loss  Discharge Condition: improved Disposition: home with HHRN, PT  Diet recommendation: heart healthy  Filed Weights   01/15/18 0405 01/16/18 0630 01/17/18 0553  Weight: 101.2 kg (223 lb 1.7 oz) 105.2 kg (231 lb 14.8 oz) 107 kg (235 lb 14.3 oz)    History of present illness:  57 year old woman presented to [name] with shortness of breath.  Found to have hyponatremia sodium 105.  Rapidly corrected to 124 with hypertonic saline.  Developed acute encephalopathy, hypercapnia was intubated, then [facility].  History of poor oral intake for 2 weeks prior to admission.    Hospital Course:  Initial concern for CPM, was fortunately not borne out.  Respiratory status improved rapidly and the patient was successfully extubated but required BiPAP overnight.  Acute metabolic encephalopathy rapidly resolved.  Hyponatremia treated with desmopressin and fluids initially then with aggressive diuresis, over 11 L.  Oxygen requirements remained high and pt continued to exhibit signs of hypercapnia associated with chronic respiratory failure.  PCCM recommended Trilogy at home and outpatient follow-up.  Both of been secured.  Individual issues as below.  Acute mixed hypercapnic, hypoxic respiratory failure with associated acute encephalopathy.  Acute encephalopathy resolved.  Suspected underlying obstructive sleep apnea, some degree of obesity hypoventilation syndrome, COPD. --Continues to do well.  Follow-up with pulmonology/sleep medicine as an outpatient has been arranged. --BiPAP per PCCM: BiPAP at night, BiPAP set at 20 EPAP at 8. 5 L oxygen to be bled in. Backup rate of 12. Goal target saturated station 88-92%  Hyponatremia, etiology unclear.  Possibly related to liver disease or heart  failure. High risk for osmotic demyelination syndrome, which can occur delayed onset, however there is no evidence to suggest this complication at this point.  --Sodium now stable, borderline low.  Chloride trending up.    Expect spontaneous resolution  Obstructive sleep apnea.  Given echo findings PCCM concern for chronic hypoxia, cor pulmonale. --Appears stable.  Trilogy at night.  Follow-up with pulmonology/sleep medicine as an outpatient.  Acute pulmonary edema secondary to IV fluids, possible acute diastolic heart failure.  Echocardiogram with normal LVEF.  Possible diastolic failure. --Outpatient [facility] per patient request has been arranged.  Possible chronic liver disease.  Abdominal ultrasound showed coarse liver echo texture compatible with chronic liver disease.  Possible hepatic steatosis.  Hepatitis panel negative. --Outpatient GI evaluation has been arranged with [place] [facility].  Adrenal insufficiency.  Random cortisol low at 1.9.  Started on Decadron 4/12. --Wean Decadron --Will need outpatient cortisol stim test.  Thrombocytopenia --Stable.  Secondary to acute illness.  Follow-up as an outpatient.  Polycythemia likely secondary to long-standing hypoxia. --Follow-up as an outpatient.  Cigarette smoker --Continue nicotine patch.  Recommend cessation  Agoraphobia, depression --Appears stable.  Morbid obesity --weight loss  Consultants:  Admitted by critical care  Procedures:  Reported PICC line placement 4/9 at outside facility.  ETT on admission, extubated 4/11  Echo Study Conclusions  - Left ventricle: The cavity size was normal. Wall thickness was increased in a pattern of mild LVH. Systolic function was normal. The estimated ejection fraction was in the range of 60% to 65%. Although no diagnostic regional wall motion abnormality was identified, this possibility cannot be completely excluded  on the basis of this study. -  Left atrium: The atrium was mildly to moderately dilated. - Right atrium: The atrium was moderately dilated.  Today's assessment: S: Feels well, no complaints. O: Vitals:  Vitals:   01/17/18 0051 01/17/18 0833  BP: 130/79 123/77  Pulse: 66 96  Resp: 18 19  Temp:  99.2 F (37.3 C)  SpO2: 97% 97%    Constitutional:  . Appears calm and comfortable Respiratory:  . CTA bilaterally, no w/r/r.  . Respiratory effort normal Cardiovascular:  . RRR, no m/r/g . No LE extremity edema   Psychiatric:  . Mental status o Mood, affect appropriate  Discharge Instructions  Discharge Instructions    Diet - low sodium heart healthy   Complete by:  As directed    Discharge instructions   Complete by:  As directed    Call your physician or seek immediate medical attention for shortness of breath, wheezing, fever or worsening of condition.   Increase activity slowly   Complete by:  As directed      Allergies as of 01/17/2018      Reactions   Aspirin    Guaifenesin & Derivatives    Paxil [paroxetine]    Penicillins    Sulfa Antibiotics       Medication List    STOP taking these medications   acetaminophen 500 MG tablet Commonly known as:  TYLENOL   guaiFENesin 100 MG/5ML liquid Commonly known as:  ROBITUSSIN   ibuprofen 200 MG tablet Commonly known as:  ADVIL,MOTRIN   NYQUIL SEVERE COLD/FLU 5-6.25-10-325 MG/15ML Liqd Generic drug:  Phenyleph-Doxylamine-DM-APAP     TAKE these medications   albuterol 108 (90 Base) MCG/ACT inhaler Commonly known as:  PROVENTIL HFA;VENTOLIN HFA Inhale 2 puffs into the lungs every 6 (six) hours as needed for wheezing or shortness of breath. Please instruct in proper MDI technique.   dexamethasone 1 MG tablet Commonly known as:  DECADRON Take 2 tablets (2 mg total) by mouth daily for 3 days, THEN 1 tablet (1 mg total) daily for 3 days. Start taking on:  01/18/2018   furosemide 20 MG tablet Commonly known  as:  LASIX Take 1 tablet (20 mg total) by mouth daily. Start taking on:  01/18/2018   nicotine 14 mg/24hr patch Commonly known as:  NICODERM CQ - dosed in mg/24 hours Place 1 patch (14 mg total) onto the skin daily.   potassium chloride SA 20 MEQ tablet Commonly known as:  K-DUR,KLOR-CON Take 1 tablet (20 mEq total) by mouth daily.            Durable Medical Equipment  (From admission, onward)        Start     Ordered   01/17/18 1035  For home use only DME 4 wheeled rolling walker with seat  Once    Question:  Patient needs a walker to treat with the following condition  Answer:  Weakness   01/17/18 1035   01/17/18 1035  For home use only DME Tub bench  Once     01/17/18 1035   01/16/18 1346  For home use only DME oxygen  Once    Question Answer Comment  Mode or (Route) Nasal cannula   Liters per Minute 4   Frequency Continuous (stationary and portable oxygen unit needed)   Oxygen delivery system Gas      01/16/18 1345   01/15/18 0921  For home use only DME Bipap  Once    Comments:  Trilogy  Heated humidity Back up rate 12 Mask of  choice  Indication: chronic respiratory failure  Question Answer Comment  Bleed in oxygen (LPM) 5   Inspiratory pressure 20   Expiratory pressure 8      01/15/18 0920     Allergies  Allergen Reactions  . Aspirin   . Guaifenesin & Derivatives   . Paxil [Paroxetine]   . Penicillins   . Sulfa Antibiotics     The results of significant diagnostics from this hospitalization (including imaging, microbiology, ancillary and laboratory) are listed below for reference.    Significant Diagnostic Studies: Dg Abd 1 View  Result Date: 01/09/2018 CLINICAL DATA:  Initial evaluation for enteric tube placement. EXAM: ABDOMEN - 1 VIEW COMPARISON:  None. FINDINGS: Enteric tube in place with tip in side hole overlying the gastric body, well beyond the GE junction. Tip projects superiorly towards the gastric fundus. Bowel gas pattern within normal  limits without obstruction or ileus. No abnormal bowel wall thickening. No acute abnormality within the visualized abdomen. Degenerative changes noted within lower lumbar spine. IMPRESSION: Tip and side hole of enteric tube overlying the stomach, well beyond the GE junction. Electronically Signed   By: Rise Mu M.D.   On: 01/09/2018 22:16   US Abdomen Complete  Result Date: 01/10/2018 CLINICAL DATA:  57 year old female with cirrhosis.  Hyponatremia. EXAM: ABDOMEN ULTRASOUND COMPLETE COMPARISON:  Portable abdomen radiograph 01/09/2018. FINDINGS: Gallbladder: No gallstones or wall thickening visualized. No sonographic Murphy sign noted by sonographer. Common bile duct: Diameter: 2 millimeters, normal Liver: Coarse hepatic echotexture, with a degree of suspected diffuse increased echogenicity. No discrete liver lesion or biliary ductal dilatation is identified. Portal vein is patent on color Doppler imaging with normal direction of blood flow towards the liver (image 41). IVC: No abnormality visualized. Pancreas: The pancreatic head and neck are identified with questionable partial atrophy (image 51). Spleen: No splenomegaly, estimated splenic length 6.4 centimeters. Right Kidney: Length: 12.8 centimeters. Echogenicity within normal limits. No mass or hydronephrosis visualized. Left Kidney: Length: Size estimated at 12.5 centimeters, the left kidney is not as well visualized as the right. No definite left hydronephrosis. Abdominal aorta: Incompletely visualized due to overlying bowel gas, visualized portions within normal limits. Other findings: Possible right pleural effusion, uncertain. IMPRESSION: 1. No acute findings identified in the abdomen by ultrasound. 2. Coarse liver echotexture compatible with chronic liver disease. Possible hepatic steatosis. No discrete liver lesion identified. Electronically Signed   By: Odessa Fleming M.D.   On: 01/10/2018 17:03   Dg Chest Port 1 View  Result Date:  01/12/2018 CLINICAL DATA:  Pulmonary edema. EXAM: PORTABLE CHEST 1 VIEW COMPARISON:  Chest x-ray from yesterday. FINDINGS: Unchanged right PICC line with the tip in the proximal right atrium. Stable cardiomegaly. Improved interstitial edema and bibasilar atelectasis. No large pleural effusion. No pneumothorax. No acute osseous abnormality. IMPRESSION: 1. Improved interstitial edema and bibasilar atelectasis. Electronically Signed   By: Obie Dredge M.D.   On: 01/12/2018 08:14   Dg Chest Port 1 View  Result Date: 01/11/2018 CLINICAL DATA:  Follow-up pulmonary edema EXAM: PORTABLE CHEST 1 VIEW COMPARISON:  01/09/2018 FINDINGS: Endotracheal tube and nasogastric catheter have been removed in the interval. A right-sided PICC line is again noted in satisfactory position. Cardiac shadow remains mildly enlarged with central vascular congestion although the degree of interstitial edema has improved from the prior study. Bibasilar atelectatic changes are noted. No sizable effusion is seen. IMPRESSION: Improved vascular congestion and interstitial edema. Bibasilar atelectatic changes are noted. Electronically Signed   By: Alcide Clever  M.D.   On: 01/11/2018 07:13   Dg Chest Port 1 View  Result Date: 01/09/2018 CLINICAL DATA:  Initial evaluation for endotracheal tube placement. EXAM: PORTABLE CHEST 1 VIEW COMPARISON:  None. FINDINGS: Patient is intubated with the tip of an endotracheal tube well positioned 5 cm above the carina. Enteric tube courses into the abdomen. Right PICC catheter in place with tip overlying the cavoatrial junction. Cardiomegaly.  Mediastinal silhouette normal. Lungs hypoinflated. Diffuse vascular congestion with interstitial prominence, suggesting pulmonary interstitial edema. Small bilateral pleural effusions, left greater than right. Associated bibasilar opacities likely reflect atelectasis and/or edema, although infiltrates could be considered in the correct clinical setting. No  pneumothorax. No acute osseous abnormality. IMPRESSION: 1. Tip of endotracheal tube well positioned 5 cm above the carina. 2. Cardiomegaly with moderate diffuse pulmonary interstitial edema and left greater than right small bilateral pleural effusions. 3. Associated bibasilar opacities favored to reflect atelectasis and/or edema. Infiltrates could be considered in the correct clinical setting. Electronically Signed   By: Rise Mu M.D.   On: 01/09/2018 22:15    Microbiology: Recent Results (from the past 240 hour(s))  MRSA PCR Screening     Status: None   Collection Time: 01/09/18  8:56 PM  Result Value Ref Range Status   MRSA by PCR NEGATIVE NEGATIVE Final    Comment:        The GeneXpert MRSA Assay (FDA approved for NASAL specimens only), is one component of a comprehensive MRSA colonization surveillance program. It is not intended to diagnose MRSA infection nor to guide or monitor treatment for MRSA infections. Performed at [facility], [address], [place], [place] [address]   Culture, blood (routine x 2)     Status: None   Collection Time: 01/09/18 11:16 PM  Result Value Ref Range Status   Specimen Description BLOOD LEFT HAND  Final   Special Requests   Final    BOTTLES DRAWN AEROBIC AND ANAEROBIC Blood Culture results may not be optimal due to an excessive volume of blood received in culture bottles   Culture   Final    NO GROWTH 5 DAYS Performed at [facility], [address] [place], [place] [address]    Report Status 01/15/2018 FINAL  Final  Culture, blood (routine x 2)     Status: None   Collection Time: 01/09/18 11:20 PM  Result Value Ref Range Status   Specimen Description BLOOD LEFT HAND  Final   Special Requests   Final    BOTTLES DRAWN AEROBIC AND ANAEROBIC Blood Culture adequate volume   Culture   Final    NO GROWTH 5 DAYS Performed at [facility], [address], [place], [place] [address]    Report Status 01/15/2018 FINAL   Final     Labs: Basic Metabolic Panel: Recent Labs  Lab 01/13/18 0256 01/13/18 0600 01/14/18 0526 01/15/18 0406 01/16/18 0500  NA 131* 133* 134* 134* 132*  K 3.7 3.7 3.6 3.5 4.2  CL 87* 86* 86* 87* 89*  CO2 33* 37* 38* 35* 35*  GLUCOSE 109* 111* 107* 99 121*  BUN 5* 5* 7 10 13   CREATININE 0.47 0.46 0.46 0.60 0.56  CALCIUM 8.2* 8.7* 8.6* 8.8* 9.3   Liver Function Tests: Recent Labs  Lab 01/15/18 0406  AST 27  ALT 25  ALKPHOS 62  BILITOT 1.7*  PROT 6.6  ALBUMIN 3.7   CBC: Recent Labs  Lab 01/14/18 0526 01/15/18 0406 01/16/18 0500 01/17/18 1103  WBC 9.0 10.0 9.2 11.2*  HGB 16.8* 17.3* 18.0* 17.5*  HCT 53.9* 54.0* 55.6* 55.4*  MCV 91.2 89.9 90.1 90.8  PLT 129* 119* 107* 117*    Recent Labs    01/09/18 2250  BNP 100.7*    Principal Problem:   Acute respiratory failure with hypoxia and hypercapnia (HCC) Active Problems:   Hyponatremia   OSA (obstructive sleep apnea)   Acute pulmonary edema (HCC)   Adrenal insufficiency (HCC)   Thrombocytopenia (HCC)   Cigarette smoker   Obesity, Class III, BMI 40-49.9 (morbid obesity) (HCC)   Time coordinating discharge: 60 minutes  Signed:  Brendia Sacks, MD [facility] 01/17/2018, 2:10 PM

## 2018-01-17 NOTE — Care Management Note (Addendum)
Case Management Note  Patient Details  Name: Stevan Bornnn L Maqueda MRN: [id number] Date of Birth: 1961/05/18  Subjective/Objective:                 [facility]. [facility]. Added tub bench and rollator to home DME, Lupita LeashDonna aware. Trilogy in room. Will deliver O2 to room prior to DC. Friend at bedside states she will provide transportation home after DME has been delivered to room.   Action/Plan:   Expected Discharge Date:                  Expected Discharge Plan:  Home w Home Health Services  In-House Referral:  NA  Discharge planning Services  CM Consult  Post Acute Care Choice:  Home Health Choice offered to:  NA  DME Arranged:  NIV, Oxygen, Tub bench, Walker rolling with seat DME Agency:  [facility]  HH Arr[facility]  [facility]  Status of Service:  Completed, signed off  If discussed at Long Length of Stay Meetings, dates discussed:    Additional Comments:  Lawerance SabalDebbie Nakisha Chai, RN 01/17/2018, 10:37 AM

## 2018-01-17 NOTE — Progress Notes (Addendum)
Physical Therapy Treatment Patient Details Name: Stephanie Baldwin MRN: [id number] DOB: 11/09/1960 Today's Date: 01/17/2018    History of Present Illness 57 year old woman [facility] with shortness of breath.  Found to have hyponatremia sodium 105.  Rapidly corrected to 124 with hypertonic saline.  Developed acute encephalopathy, hypercapnia was intubated, then transferred to [facility] for critical care evaluation.  History of poor oral intake for 2 weeks prior to admission.  Initial concern for CPM.  Respiratory status improved rapidly and the patient was successfully extubated but required BiPAP overnight.  Acute metabolic encephalopathy rapidly resolved.     PT Comments    Pt admitted with above diagnosis. Pt currently with functional limitations due to endurance deficits and incr O2 needs. Pt was able to ambulate on unit.  Supervision level with rollator.  Going home today. Continues to need 4LO2 to keep sats >90%.   Pt will benefit from skilled PT to increase their independence and safety with mobility to allow discharge to the venue listed below.     Follow Up Recommendations  No PT follow up;Supervision - Intermittent     Equipment Recommendations  (rollator, portable O2 concentrator requested by pt, tub bench)    Recommendations for Other Services       Precautions / Restrictions Precautions Precautions: Fall Restrictions Weight Bearing Restrictions: No    Mobility  Bed Mobility Overal bed mobility: Independent             General bed mobility comments: HOB flat  Transfers Overall transfer level: Needs assistance Equipment used: Rolling walker (2 wheeled) Transfers: Sit to/from Stand Sit to Stand: Independent            Ambulation/Gait Ambulation/Gait assistance: Supervision Ambulation Distance (Feet): 280 Feet Assistive device: 4-wheeled walker Gait Pattern/deviations: Step-through pattern;Decreased stride length;Trunk flexed    Gait velocity interpretation: 1.31 - 2.62 ft/sec, indicative of limited community ambulator General Gait Details: Pt did well with rollator.  Pt desat on RA at rest to 89%.  Pt desat to 84% when walking  needing 4L to keep sats >90%.  Other VSS.     Stairs             Wheelchair Mobility    Modified Rankin (Stroke Patients Only)       Balance Overall balance assessment: Needs assistance Sitting-balance support: No upper extremity supported;Feet supported Sitting balance-Leahy Scale: Good     Standing balance support: No upper extremity supported;During functional activity Standing balance-Leahy Scale: Fair Standing balance comment: can stand statically without device at sink, needs rollator for ambulation                            Cognition Arousal/Alertness: Awake/alert Behavior During Therapy: WFL for tasks assessed/performed Overall Cognitive Status: Within Functional Limits for tasks assessed                                        Exercises      General Comments        Pertinent Vitals/Pain Pain Assessment: No/denies pain    Home Living                      Prior Function            PT Goals (current goals can now be found in the care plan  section) Acute Rehab PT Goals Patient Stated Goal: to gohome Progress towards PT goals: Progressing toward goals    Frequency    Min 3X/week      PT Plan Current plan remains appropriate    Co-evaluation              AM-PAC PT "6 Clicks" Daily Activity  Outcome Measure  Difficulty turning over in bed (including adjusting bedclothes, sheets and blankets)?: None Difficulty moving from lying on back to sitting on the side of the bed? : None Difficulty sitting down on and standing up from a chair with arms (e.g., wheelchair, bedside commode, etc,.)?: None Help needed moving to and from a bed to chair (including a wheelchair)?: A Little Help needed walking in  hospital room?: A Little Help needed climbing 3-5 steps with a railing? : Total 6 Click Score: 19    End of Session Equipment Utilized During Treatment: Gait belt;Oxygen Activity Tolerance: Patient limited by fatigue Patient left: in bed;with call bell/phone within reach;with family/visitor present(sitting EOB) Nurse Communication: Mobility status PT Visit Diagnosis: Unsteadiness on feet (R26.81);Muscle weakness (generalized) (M62.81)     Time: 1610-9604 PT Time Calculation (min) (ACUTE ONLY): 15 min  Charges:  $Gait Training: 8-22 mins                    G Codes:       Akanksha Bellmore,PT Acute Rehabilitation [phone] (pager)    Berline Lopes 01/17/2018, 1:40 PM

## 2018-01-17 NOTE — Progress Notes (Signed)
Pts Triology machine is alarming APNEA every couple mins. The machine is working properly, although it is alarming every time the patient is apneic. RN called [facility] to see about changing the alarm settings to allow pt and family to sleep. Pt in NAD. Will continue to monitor. Caswell Corwinana C Erlinda Solinger, RN 01/17/18 2:05 AM

## 2018-01-17 NOTE — Progress Notes (Signed)
Placed patient on Home Trilogy via AVAPS mode. Oxygen set at 4lpm.

## 2018-01-17 NOTE — Plan of Care (Signed)
  Problem: Coping: Goal: Level of anxiety will decrease Outcome: Progressing  Pt has been anxious about going home tomorrow. Writer talked with patient about her anxiety and ways to cope with her anxiety and her breathing. Writer reassured pt that she has resources available to her such as [facility] and to use them as she needs to. Writer talked about healthy coping skills and taking deep breaths. Writer encouraged pt to build her self-confidence, as she has doubts about her own abilities. Writer reminded her that she is getting better every day and she needs to focus on her progress and moving forward rather than ruminating on the past. Pts mother was present and encouraged pt to do the same and was supportive and encouraging to patient.

## 2018-01-21 ENCOUNTER — Telehealth: Payer: Self-pay | Admitting: Internal Medicine

## 2018-01-21 NOTE — Telephone Encounter (Signed)
Spoke with Aurther Lofterry at [facility], requesting a VO for PT 1X weekly for 3 weeks.  I advised that pt has not been seen in clinic by our office, is not scheduled to be seen until 5/13.  Aurther Lofterry states he will look for another provider to address order.  Will close encounter.

## 2018-01-22 ENCOUNTER — Institutional Professional Consult (permissible substitution): Payer: Medicaid Other | Admitting: Pulmonary Disease

## 2018-01-22 ENCOUNTER — Telehealth: Payer: Self-pay | Admitting: Pulmonary Disease

## 2018-01-22 NOTE — Telephone Encounter (Signed)
Stephanie Baldwin, [name], [place]      01/21/18 4:27 PM  Note    [facility], requesting a VO for PT 1X weekly for 3 weeks.  I advised that pt has not been seen in clinic by our office, is not scheduled to be seen until 5/13.  Aurther Lofterry states he will look for another provider to address order.  Will close encounter.      Spoke with Aurther Lofterry and he states Dr. Craige CottaSood requested PT from the hospital. I advised that I didn't know if we could sign off on the order but I would ask Dr. Craige CottaSood. Please advise if it is ok to give verbal order for PT.

## 2018-01-24 ENCOUNTER — Telehealth: Payer: Self-pay | Admitting: Pulmonary Disease

## 2018-01-24 NOTE — Telephone Encounter (Signed)
Called Stephanie Baldwin and advised her to call back so I can relay the message from VS.   He stated the orders should be signed by her PCP.

## 2018-01-24 NOTE — Telephone Encounter (Signed)
Should be signed by her PCP.

## 2018-01-24 NOTE — Telephone Encounter (Signed)
That's fine

## 2018-01-24 NOTE — Telephone Encounter (Signed)
[facility] to make aware of VS' recs.  Nothing further needed at this time.

## 2018-01-24 NOTE — Telephone Encounter (Signed)
Spoke with Lelon MastSamantha and she states the patient has not seen a doctor in years. She saw Dr. Craige CottaSood in the hospital and has an appt with Dr. Sherene SiresWert on 5/13/. There are going to have to stop services for this patient. Dr. Sherene SiresWert would you sign the home health orders since she is coming in to see you? Please advise.

## 2018-01-25 NOTE — Telephone Encounter (Signed)
Spoke with Lelon MastSamantha and gave her a verbal order for home health. Nothing further is needed.

## 2018-01-29 ENCOUNTER — Telehealth: Payer: Self-pay | Admitting: Pulmonary Disease

## 2018-01-29 NOTE — Telephone Encounter (Signed)
Spoke with Stephanie Baldwin and advised it was ok to place orders under MW (see previous message) for verbal orders. Pt is seeing Dr. Sherene Sires on 5/13 but was seen in the hospital by Dr. Craige Cotta. Stephanie Baldwin understood and will go ahead with PT. Nothing further is needed.

## 2018-01-31 ENCOUNTER — Encounter: Payer: Self-pay | Admitting: Cardiology

## 2018-01-31 ENCOUNTER — Ambulatory Visit: Payer: Medicaid Other | Admitting: Cardiology

## 2018-01-31 VITALS — BP 132/80 | HR 113 | Ht 62.0 in | Wt 235.0 lb

## 2018-01-31 DIAGNOSIS — F17210 Nicotine dependence, cigarettes, uncomplicated: Secondary | ICD-10-CM

## 2018-01-31 DIAGNOSIS — E871 Hypo-osmolality and hyponatremia: Secondary | ICD-10-CM | POA: Diagnosis not present

## 2018-01-31 DIAGNOSIS — G4733 Obstructive sleep apnea (adult) (pediatric): Secondary | ICD-10-CM

## 2018-01-31 DIAGNOSIS — I509 Heart failure, unspecified: Secondary | ICD-10-CM

## 2018-01-31 NOTE — Addendum Note (Signed)
Addended by: Craige Cotta on: 01/31/2018 01:33 PM   Modules accepted: Orders

## 2018-01-31 NOTE — Progress Notes (Signed)
Cardiology Office Note:    Date:  01/31/2018   ID:  Stephanie Baldwin, DOB 1961-08-02, MRN [id number]  PCP:  Patient, No Pcp Per  Cardiologist:  Garwin Brothers, MD   Referring MD: Carin Hock, MD    ASSESSMENT:    1. Congestive heart failure, unspecified HF chronicity, unspecified heart failure type (HCC)   2. OSA (obstructive sleep apnea)   3. Cigarette smoker   4. Hyponatremia   5. Obesity, Class III, BMI 40-49.9 (morbid obesity) (HCC)    PLAN:    In order of problems listed above:  1. Primary prevention stressed with the patient.  Importance of compliance with diet and medication stressed.  I reviewed records extensively and discussed the findings with her including echocardiographic findings. 2. I spent 5 minutes with the patient discussing solely about smoking. Smoking cessation was counseled. I suggested to the patient also different medications and pharmacological interventions. Patient is keen to try stopping on its own at this time. He will get back to me if he needs any further assistance in this matter. 3. She is on small dose of diuretic.  She has significant bilateral pedal edema.  She she is being evaluated by pulmonary and is going to get established with the medical doctor for internal medicine and routine care maintenance. 4. In view of the fact that she has had a history of hyponatremia and is on a diuretic we will do a Chem-7 today. 5. She has multiple risk factors for coronary artery disease.  She appears stable from a stress test and we will do a Lexiscan sestamibi. 6. Patient will be seen in follow-up appointment in 6 months or earlier if the patient has any concerns 7. She knows to go to the nearest emergency room for any significant concerns.   Medication Adjustments/Labs and Tests Ordered: Current medicines are reviewed at length with the patient today.  Concerns regarding medicines are outlined above.  Orders Placed This Encounter  Procedures  .  Basic metabolic panel  . MYOCARDIAL PERFUSION IMAGING   No orders of the defined types were placed in this encounter.    History of Present Illness:    Stephanie Baldwin is a 57 y.o. female who is being seen today for the evaluation of congestive heart failure at the request of Scatliffe, Gypsy Balsam, MD.  Patient is a pleasant 57 year old female.  She has come here along with her mother.  Apparently she does not take because she is too nervous to try.  She was admitted to the hospital with congestive heart failure.  She had significant edema volume of 40.  I reviewed some of her records.  She was treated with diuretic therapy.  However there was an issue about hyponatremia and initially all measures were taken to treated and sodium came up.  It seems at some point it may have come up a little too quick for which reason then this slowed down the process.  Patient was treated and released and is doing well.  Echocardiogram has revealed preserved systolic function with biatrial enlargement.  At this point she denies any problems other than pedal edema.  She was sent to be evaluated for issue of congestive heart failure.  She is an active heavy smoker she quit only after being admitted to the hospital recently.  She uses oxygen all the time.  Past Medical History:  Diagnosis Date  . Adrenal insufficiency (HCC) 01/16/2018  . Agoraphobia   . Depression   .  Obesity, Class III, BMI 40-49.9 (morbid obesity) (HCC) 01/16/2018  . OSA (obstructive sleep apnea) 01/16/2018  . Tobacco abuse     History reviewed. No pertinent surgical history.  Current Medications: Current Meds  Medication Sig  . albuterol (PROVENTIL HFA;VENTOLIN HFA) 108 (90 Base) MCG/ACT inhaler Inhale 2 puffs into the lungs every 6 (six) hours as needed for wheezing or shortness of breath. Please instruct in proper MDI technique.  . furosemide (LASIX) 20 MG tablet Take 1 tablet (20 mg total) by mouth daily.  . nicotine (NICODERM CQ - DOSED IN  MG/24 HOURS) 14 mg/24hr patch Place 1 patch (14 mg total) onto the skin daily.  . potassium chloride SA (K-DUR,KLOR-CON) 20 MEQ tablet Take 1 tablet (20 mEq total) by mouth daily.     Allergies:   Aspirin; Guaifenesin & derivatives; Paxil [paroxetine]; Penicillins; and Sulfa antibiotics   Social History   Socioeconomic History  . Marital status: Divorced    Spouse name: Not on file  . Number of children: Not on file  . Years of education: Not on file  . Highest education level: Not on file  Occupational History  . Not on file  Social Needs  . Financial resource strain: Patient refused  . Food insecurity:    Worry: Never true    Inability: Never true  . Transportation needs:    Medical: No    Non-medical: No  Tobacco Use  . Smoking status: Current Every Day Smoker    Packs/day: 1.00    Types: Cigarettes  . Smokeless tobacco: Never Used  Substance and Sexual Activity  . Alcohol use: Never    Frequency: Never  . Drug use: Never  . Sexual activity: Not Currently  Lifestyle  . Physical activity:    Days per week: 0 days    Minutes per session: 0 min  . Stress: Only a little  Relationships  . Social connections:    Talks on phone: Patient refused    Gets together: Never    Attends religious service: Never    Active member of club or organization: No    Attends meetings of clubs or organizations: Never    Relationship status: Divorced  Other Topics Concern  . Not on file  Social History Narrative  . Not on file     Family History: The patient's family history includes Cancer in her father and mother; Diabetes in her mother; Heart disease in her mother; Hypothyroidism in her mother; Stroke in her mother.  ROS:   Please see the history of present illness.    All other systems reviewed and are negative.  EKGs/Labs/Other Studies Reviewed:    The following studies were reviewed today: Sinus rhythm and nonspecific ST-T changes of the EKG.  Echocardiogram with  preserved systolic function with biatrial enlargement.   Recent Labs: 01/09/2018: B Natriuretic Peptide 100.7; Magnesium 2.1 01/10/2018: TSH 2.224 01/15/2018: ALT 25 01/16/2018: BUN 13; Creatinine, Ser 0.56; Potassium 4.2; Sodium 132 01/17/2018: Hemoglobin 17.5; Platelets 117  Recent Lipid Panel    Component Value Date/Time   CHOL 146 01/12/2018 0542   TRIG 51 01/12/2018 0542    Physical Exam:    VS:  BP 132/80 (BP Location: Right Arm, Patient Position: Sitting, Cuff Size: Normal)   Pulse (!) 113   Ht  (1.575 m)   Wt 235 lb (106.6 kg)   SpO2 95%   BMI 42.98 kg/m     Wt Readings from Last 3 Encounters:  01/31/18 235 lb (106.6 kg)  01/17/18 235 lb 14.3 oz (107 kg)     GEN: Patient is in no acute distress HEENT: Normal NECK: No JVD; No carotid bruits LYMPHATICS: No lymphadenopathy CARDIAC: S1 S2 regular, 2/6 systolic murmur at the apex. RESPIRATORY:  Clear to auscultation without rales, wheezing or rhonchi  ABDOMEN: Soft, non-tender, non-distended MUSCULOSKELETAL: Bilateral 3+ edema; No deformity  SKIN: Warm and dry NEUROLOGIC:  Alert and oriented x 3 PSYCHIATRIC:  Normal affect    Signed, Garwin Brothers, MD  01/31/2018 11:06 AM    [facility]

## 2018-01-31 NOTE — Patient Instructions (Signed)
Medication Instructions:  Your physician recommends that you continue on your current medications as directed. Please refer to the Current Medication list given to you today.  Labwork: Your physician recommends that you have the following labs drawn: BMP  Testing/Procedures: Your physician has requested that you have a lexiscan myoview. For further information please visit [email or web address] Please follow instruction sheet, as given.  Follow-Up: Your physician recommends that you schedule a follow-up appointment in: 6 months  Any Other Special Instructions Will Be Listed Below (If Applicable).     If you need a refill on your cardiac medications before your next appointment, please call your pharmacy.   [facility]  Garey Ham, RN, BSN

## 2018-02-01 LAB — BASIC METABOLIC PANEL
BUN/Creatinine Ratio: 6 — ABNORMAL LOW (ref 9–23)
BUN: 3 mg/dL — ABNORMAL LOW (ref 6–24)
CO2: 27 mmol/L (ref 20–29)
Calcium: 10.1 mg/dL (ref 8.7–10.2)
Chloride: 102 mmol/L (ref 96–106)
Creatinine, Ser: 0.52 mg/dL — ABNORMAL LOW (ref 0.57–1.00)
GFR calc Af Amer: 124 mL/min/{1.73_m2} (ref >59)
GFR, EST NON AFRICAN AMERICAN: 107 mL/min/{1.73_m2} (ref >59)
GLUCOSE: 115 mg/dL — AB (ref 65–99)
POTASSIUM: 4.6 mmol/L (ref 3.5–5.2)
SODIUM: 143 mmol/L (ref 134–144)

## 2018-02-04 ENCOUNTER — Telehealth (HOSPITAL_COMMUNITY): Payer: Self-pay | Admitting: *Deleted

## 2018-02-04 NOTE — Telephone Encounter (Signed)
Left message on voicemail per DPR in reference to upcoming appointment scheduled on 02/05/18 at 1245 with detailed instructions given per Myocardial Perfusion Study Information Sheet for the test. LM to arrive 15 minutes early, and that it is imperative to arrive on time for appointment to keep from having the test rescheduled. If you need to cancel or reschedule your appointment, please call the office within 24 hours of your appointment. Failure to do so may result in a cancellation of your appointment, and a $50 no show fee. Phone number given for call back for any questions.

## 2018-02-05 ENCOUNTER — Ambulatory Visit (HOSPITAL_COMMUNITY): Payer: Medicaid Other | Attending: Cardiovascular Disease

## 2018-02-05 ENCOUNTER — Telehealth: Payer: Self-pay | Admitting: Pulmonary Disease

## 2018-02-05 VITALS — Ht 62.0 in | Wt 235.0 lb

## 2018-02-05 DIAGNOSIS — I509 Heart failure, unspecified: Secondary | ICD-10-CM

## 2018-02-05 DIAGNOSIS — R079 Chest pain, unspecified: Secondary | ICD-10-CM

## 2018-02-05 MED ORDER — REGADENOSON 0.4 MG/5ML IV SOLN
0.4000 mg | Freq: Once | INTRAVENOUS | Status: AC
  Administered 2018-02-05: 0.4 mg via INTRAVENOUS

## 2018-02-05 MED ORDER — TECHNETIUM TC 99M TETROFOSMIN IV KIT
32.5000 | PACK | Freq: Once | INTRAVENOUS | Status: AC | PRN
  Administered 2018-02-05: 32.5 via INTRAVENOUS
  Filled 2018-02-05: qty 33

## 2018-02-05 NOTE — Telephone Encounter (Signed)
Stephanie Baldwin is seeing patient for physical therapy this week. He wants to make sure that is ok per VS.    Dr. Craige Cotta please advise, thanks.

## 2018-02-06 ENCOUNTER — Ambulatory Visit (HOSPITAL_COMMUNITY): Payer: Medicaid Other | Attending: Cardiovascular Disease

## 2018-02-06 LAB — MYOCARDIAL PERFUSION IMAGING
CHL CUP NUCLEAR SDS: 5
CHL CUP NUCLEAR SRS: 6
LV dias vol: 59 mL (ref 46–106)
LV sys vol: 13 mL
NUC STRESS TID: 0.97
Peak HR: 121 {beats}/min
RATE: 0.34
Rest HR: 117 {beats}/min
SSS: 11

## 2018-02-06 MED ORDER — TECHNETIUM TC 99M TETROFOSMIN IV KIT
32.5000 | PACK | Freq: Once | INTRAVENOUS | Status: AC | PRN
  Administered 2018-02-06: 32.5 via INTRAVENOUS
  Filled 2018-02-06: qty 33

## 2018-02-06 NOTE — Telephone Encounter (Signed)
I have never seen this patient before, and therefore can't comment.  She was seen by Dr. Marchelle Gearing while she was in hospital.  Please route to him to address or advised to discuss with her PCP.

## 2018-02-06 NOTE — Telephone Encounter (Signed)
Ok with me 

## 2018-02-06 NOTE — Telephone Encounter (Signed)
Stephanie Baldwin of Dr. Evlyn Courier response. He stated that VS has denied order before in the past and was unsure why the message was sent to VS. He wanted the message to be routed to Dr. Sherene Sires since Dr. Sherene Sires signed off on the orders in the past.   MW, please advise if you are ok with the patient skipping PT for this week and resuming next week. Per her chart, she is scheduled as a consult for 5/13 with you. Thank!

## 2018-02-07 NOTE — Telephone Encounter (Signed)
Called spoke with Aurther Loft w/ [facility] Advised that MW Dorette Grate to skip PT this week and resume next week Aurther Loft voiced his understanding Nothing further needed; will sign off

## 2018-02-11 ENCOUNTER — Ambulatory Visit (INDEPENDENT_AMBULATORY_CARE_PROVIDER_SITE_OTHER): Payer: Medicaid Other | Admitting: Internal Medicine

## 2018-02-11 ENCOUNTER — Other Ambulatory Visit (INDEPENDENT_AMBULATORY_CARE_PROVIDER_SITE_OTHER): Payer: Medicaid Other

## 2018-02-11 ENCOUNTER — Encounter: Payer: Self-pay | Admitting: Internal Medicine

## 2018-02-11 VITALS — BP 142/80 | HR 133 | Ht 62.0 in | Wt 234.6 lb

## 2018-02-11 DIAGNOSIS — J9612 Chronic respiratory failure with hypercapnia: Secondary | ICD-10-CM

## 2018-02-11 DIAGNOSIS — R0609 Other forms of dyspnea: Secondary | ICD-10-CM

## 2018-02-11 DIAGNOSIS — J9611 Chronic respiratory failure with hypoxia: Secondary | ICD-10-CM | POA: Diagnosis not present

## 2018-02-11 DIAGNOSIS — I2781 Cor pulmonale (chronic): Secondary | ICD-10-CM | POA: Diagnosis not present

## 2018-02-11 DIAGNOSIS — F17210 Nicotine dependence, cigarettes, uncomplicated: Secondary | ICD-10-CM | POA: Diagnosis not present

## 2018-02-11 LAB — CBC WITH DIFFERENTIAL/PLATELET
BASOS ABS: 0 10*3/uL (ref 0.0–0.1)
BASOS PCT: 0.4 % (ref 0.0–3.0)
EOS ABS: 0.1 10*3/uL (ref 0.0–0.7)
Eosinophils Relative: 0.6 % (ref 0.0–5.0)
HEMATOCRIT: 47.3 % — AB (ref 36.0–46.0)
HEMOGLOBIN: 15.6 g/dL — AB (ref 12.0–15.0)
LYMPHS PCT: 36.5 % (ref 12.0–46.0)
Lymphs Abs: 4.2 10*3/uL — ABNORMAL HIGH (ref 0.7–4.0)
MCHC: 32.9 g/dL (ref 30.0–36.0)
MCV: 85.3 fl (ref 78.0–100.0)
MONO ABS: 0.8 10*3/uL (ref 0.1–1.0)
Monocytes Relative: 6.7 % (ref 3.0–12.0)
Neutro Abs: 6.5 10*3/uL (ref 1.4–7.7)
Neutrophils Relative %: 55.8 % (ref 43.0–77.0)
Platelets: 144 10*3/uL — ABNORMAL LOW (ref 150.0–400.0)
RBC: 5.55 Mil/uL — ABNORMAL HIGH (ref 3.87–5.11)
RDW: 16.4 % — ABNORMAL HIGH (ref 11.5–15.5)
WBC: 11.6 10*3/uL — AB (ref 4.0–10.5)

## 2018-02-11 LAB — BRAIN NATRIURETIC PEPTIDE: PRO B NATRI PEPTIDE: 30 pg/mL (ref 0.0–100.0)

## 2018-02-11 MED ORDER — SPIRONOLACTONE 25 MG PO TABS: 25.0000 mg | ORAL_TABLET | Freq: Every day | ORAL | 11 refills | Status: DC

## 2018-02-11 NOTE — Progress Notes (Signed)
Subjective:     Patient ID: Stephanie Baldwin, female   DOB: 1961/07/16,    MRN: [id number]  HPI  68 yowf  Quit smoking April 9th 2019  with new onset noct sob initially just while supine all started about  A week  Winter 2019 assoc bad cough and gradually worsened assoc with vomiting x 2 weeks  Then admit:  Admit date: 01/09/2018 Discharge date: 01/17/2018  Recommendations for Outpatient Follow-up:  Acute mixed hypercapnic, hypoxic respiratory failure with associated acute encephalopathy. Acute encephalopathy resolved. Suspected underlying obstructive sleep apnea, some degree of obesity hypoventilation syndrome, COPD. --Continues to do well.Follow-up with pulmonology/sleep medicine as an outpatient has been arranged. --BiPAP per PCCM:BiPAP at night, BiPAP set at 20 EPAP at 8. 5 L oxygen to be bled in.Backup rate of 12.Goal target saturated station 88-92%   Acute pulmonary edema secondary to IV fluids, possible acute diastolic heart failure.  --Outpatient follow-up with cardiology clinic in [facility] per patient request has been arranged.  Possible chronic liver disease. Outpatient GI evaluation has been arranged with [place] [facility].  Adrenal insufficiency. Random cortisol low at 1.9.  Decadron taper. Consider outpatient cortisol stim test.  Thrombocytopenia --Stable.  Secondary to acute illness.    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  Cigarette smoker --Recommend cessation     Since d/c from [facility] has been placed on Trelegy / 02 every noct and able to sleep flat ?> Feels back to nl when she is able to use the trelegy effectively   02/11/2018 1st [facility] office visit/ [facility]   Chief Complaint  Patient presents with  . Consult    COPD. ER visit 4/10, has panic attacks with Trelegy   Doe now = MMRC3 = can't walk 100 yards even at a slow pace at a flat grade s stopping due to sob  Even on 02  Very limited insight into medical issues/  02 rx  Feels the trelegy is choking her at times and gives her problems with mask but using it most of the time  No better on saba    No obvious day to day or daytime variability or assoc excess/ purulent sputum or mucus plugs or hemoptysis or cp or chest tightness, subjective wheeze or overt sinus or hb symptoms. No unusual exposure hx or h/o childhood pna/ asthma or knowledge of premature birth.  Sleeping ok flat without nocturnal  or early am exacerbation  of respiratory  c/o's or need for noct saba. Also denies any obvious fluctuation of symptoms with weather or environmental changes or other aggravating or alleviating factors except as outlined above   Current Allergies, Complete Past Medical History, Past Surgical History, Family History, and Social History were reviewed in [employer] record.  ROS  The following are not active complaints unless bolded Hoarseness, sore throat, dysphagia, dental problems, itching, sneezing,  nasal congestion or discharge of excess mucus or purulent secretions, ear ache,   fever, chills, sweats, unintended wt loss or wt gain, classically pleuritic or exertional cp,  orthopnea pnd or leg swelling, presyncope, palpitations, abdominal pain, anorexia, nausea, vomiting, diarrhea  or change in bowel habits or change in bladder habits, change in stools or change in urine, dysuria, hematuria,  rash, arthralgias, visual complaints, headache, numbness, weakness or ataxia or problems with walking or coordination,  change in mood= anxious or memory.          Current Meds  Medication Sig  . albuterol (PROVENTIL HFA;VENTOLIN HFA) 108 (90 Base)  MCG/ACT inhaler Inhale 2 puffs into the lungs every 6 (six) hours as needed for wheezing or shortness of breath. Please instruct in proper MDI technique.  . furosemide (LASIX) 20 MG tablet Take 1 tablet (20 mg total) by mouth daily.  . nicotine (NICODERM CQ - DOSED IN MG/24 HOURS) 14 mg/24hr patch Place 1  patch (14 mg total) onto the skin daily.  . potassium chloride SA (K-DUR,KLOR-CON) 20 MEQ tablet Take 1 tablet (20 mEq total) by mouth daily.        Review of Systems     Objective:   Physical Exam    obese amb wf   Wt Readings from Last 3 Encounters:  02/11/18 234 lb 9.6 oz (106.4 kg)  02/05/18 235 lb (106.6 kg)  01/31/18 235 lb (106.6 kg)     Vital signs reviewed - Note on arrival 02 sats  93% on 2lpm continuous   But 93% rest RA     HEENT: nl dentition, turbinates bilaterally, and oropharynx. Nl external ear canals without cough reflex - Modified Mallampati Score =   3/4   NECK :  without JVD/Nodes/TM/ nl carotid upstrokes bilaterally   LUNGS: no acc muscle use,  Nl contour chest which is clear to A and P bilaterally without cough on insp or exp maneuvers   CV:  RRR  no s3 or murmur or increase in P2, and  1+ pitting edema both legs  ABD:  soft and nontender with nl inspiratory excursion in the supine position. No bruits or organomegaly appreciated, bowel sounds nl  MS:  Nl gait/ ext warm without deformities, calf tenderness, cyanosis or clubbing No obvious joint restrictions   SKIN: warm and dry without lesions    NEURO:  alert, approp, nl sensorium with  no motor or cerebellar deficits apparent.     I personally reviewed images and agree with radiology impression as follows:  CXR:   01/12/18  Improved interstitial edema and bibasilar atelectasis.    Labs ordered/ reviewed:      Chemistry      Component Value Date/Time   NA 140 02/11/2018 1710   NA 143 01/31/2018 1105   K 4.3 02/11/2018 1710   CL 101 02/11/2018 1710   CO2 31 02/11/2018 1710   BUN 3 (L) 02/11/2018 1710   BUN 3 (L) 01/31/2018 1105   CREATININE 0.61 02/11/2018 1710      Component Value Date/Time   CALCIUM 9.7 02/11/2018 1710   ALKPHOS 62 01/15/2018 0406   AST 27 01/15/2018 0406   ALT 25 01/15/2018 0406   BILITOT 1.7 (H) 01/15/2018 0406        Lab Results  Component Value  Date   WBC 11.6 (H) 02/11/2018   HGB 15.6 (H) 02/11/2018   HCT 47.3 (H) 02/11/2018   MCV 85.3 02/11/2018   PLT 144.0 (L) 02/11/2018       EOS                                                                0.1                                   02/11/2018   Lab Results  Component Value Date   DDIMER 1.10 (H) 02/11/2018      Lab Results  Component Value Date   TSH 2.224 01/10/2018     Lab Results  Component Value Date   PROBNP 30.0 02/11/2018         Assessment:

## 2018-02-11 NOTE — Patient Instructions (Addendum)
Only use your albuterol as a rescue medication to be used if you can't catch your breath by resting or doing a relaxed purse lip breathing pattern.  - The less you use it, the better it will work when you need it. - Ok to use up to 2 puffs  every 4 hours if you must but call for immediate appointment if use goes up over your usual need - Don't leave home without it !!  (think of it like the spare tire for your car)    No need for 02 at rest but up to 4lpm with walking   Stop potassium and start aldactone 25  mg each am with your lasix   We will have [facility] do a best fit evaluation for your 02 (that should meet all your needs for ambulatory 02)    Please remember to go to the lab department downstairs in the basement  for your tests - we will call you with the results when they are available.      Please schedule a follow up office visit in 2 weeks, sooner if needed  with all medications /inhalers/ solutions in hand so we can verify exactly what you are taking. This includes all medications from all doctors and over the counters to see NP while we set you up with one of our sleep doctors

## 2018-02-12 ENCOUNTER — Other Ambulatory Visit: Payer: Self-pay | Admitting: Internal Medicine

## 2018-02-12 ENCOUNTER — Encounter: Payer: Self-pay | Admitting: Internal Medicine

## 2018-02-12 ENCOUNTER — Telehealth: Payer: Self-pay | Admitting: Internal Medicine

## 2018-02-12 DIAGNOSIS — R0609 Other forms of dyspnea: Principal | ICD-10-CM

## 2018-02-12 DIAGNOSIS — J9612 Chronic respiratory failure with hypercapnia: Secondary | ICD-10-CM

## 2018-02-12 DIAGNOSIS — R0602 Shortness of breath: Secondary | ICD-10-CM

## 2018-02-12 DIAGNOSIS — J9611 Chronic respiratory failure with hypoxia: Secondary | ICD-10-CM | POA: Insufficient documentation

## 2018-02-12 DIAGNOSIS — I2781 Cor pulmonale (chronic): Secondary | ICD-10-CM | POA: Insufficient documentation

## 2018-02-12 LAB — BASIC METABOLIC PANEL
BUN: 3 mg/dL — ABNORMAL LOW (ref 6–23)
CO2: 31 meq/L (ref 19–32)
Calcium: 9.7 mg/dL (ref 8.4–10.5)
Chloride: 101 mEq/L (ref 96–112)
Creatinine, Ser: 0.61 mg/dL (ref 0.40–1.20)
GFR: 107.69 mL/min (ref >60.00)
Glucose, Bld: 129 mg/dL — ABNORMAL HIGH (ref 70–99)
POTASSIUM: 4.3 meq/L (ref 3.5–5.1)
SODIUM: 140 meq/L (ref 135–145)

## 2018-02-12 LAB — D-DIMER, QUANTITATIVE (NOT AT ARMC): D DIMER QUANT: 1.1 ug{FEU}/mL — AB (ref <0.50)

## 2018-02-12 MED ORDER — FUROSEMIDE 20 MG PO TABS: 20.0000 mg | ORAL_TABLET | Freq: Every day | ORAL | 0 refills | Status: DC

## 2018-02-12 NOTE — Assessment & Plan Note (Addendum)
--  BiPAP per PCCM:BiPAP at night, BiPAP set at 20 EPAP at 8. 5 L oxygen to be bled in.Backup rate of 12.Goal target saturated station > 90% - HCO3 02/11/2018  = 31 (down from admit)  - 02/11/2018   Walked RA x one lap @ 185 stopped due to  Sob with sats 85%> corrected on 4lpm so may not be candidate for POC > referred for best fit   Appears to be doing better - clearly hypercarbia improving ? If longerterm  needs trelegy > for now will defer w/u to sleep medicine and asked her to wear the trelegy as much as possible pending further eval   As to daytime needs, goal is to keep sats > 90% so when gets where she's going and sits down for a few minutes does not need 02 at rest based on today's eval

## 2018-02-12 NOTE — Assessment & Plan Note (Addendum)
Echo 01/10/18  Left ventricle: The cavity size was normal. Wall thickness was   increased in a pattern of mild LVH. Systolic function was normal.   The estimated ejection fraction was in the range of 60% to 65%.   Although no diagnostic regional wall motion abnormality was   identified, this possibility cannot be completely excluded on the   basis of this study. - Left atrium: The atrium was mildly to moderately dilated. - Right atrium: The atrium was moderately dilated. 02/06/18  myoview nl  02/11/2018   Walked RA x one lap @ 185 stopped due to  Sob with sats 85% > see chronic resp failure  - Spirometry 02/11/2018  FEV1 1.61 (65%)  Ratio 75    Not clear why she is so short of breath but this is certainly not copd  and not clear to what extent this is actually a pulmonary  problem but pt does appear to have difficult to sort out respiratory symptoms of unknown origin for which  DDX  = almost all start with A and  include Adherence, Ace Inhibitors, Acid Reflux, Active Sinus Disease, Alpha 1 Antitripsin deficiency, Anxiety masquerading as Airways dz,  ABPA,  Allergy(esp in young), Aspiration (esp in elderly), Adverse effects of meds,  Active smokers, A bunch of PE's/clot burden (a few small clots can't cause this syndrome unless there is already severe underlying pulm or vascular dz with poor reserve),  Anemia or thyroid disorder, plus two Bs  = Bronchiectasis and Beta blocker use..and one C= CHF    Adherence is always the initial "prime suspect" and is a multilayered concern that requires a "trust but verify" approach in every patient - starting with knowing how to use medications, especially inhalers, correctly, keeping up with refills and understanding the fundamental difference between maintenance and prns vs those medications only taken for a very short course and then stopped and not refilled.  - rec return with all meds in hand using a trust but verify approach to confirm accurate Medication   Reconciliation The principal here is that until we are certain that the  patients are doing what we've asked, it makes no sense to ask them to do more.    ? Active smoking > denies since January 08 2018/ reinforced   ? Anxiety/ depression  > usually at the bottom of this list of usual suspects but should be much higher on this pt's based on H and P and may interfere with adherence and also interpretation of response or lack thereof to symptom management which can be quite subjective.    ? Anemia/ thyroid dz > ruled out now and at admit  ? A bunch of PE's >   D dimer is elevated and she has evidence of elevated R H pressures so needs CTa to complete the w/u   ? CHF > echo 01/10/18 reviewed and typical of cor pulmonale    I had an extended discussion with the patient reviewing all relevant studies completed to date and  lasting 25 minutes of a 40  minute transition of care office  visit with pt new to me     re  severe non-specific but potentially very serious refractory respiratory symptoms of uncertain and potentially multiple  etiologies.    device teaching   extended face to face time for this visit (hfa/ trelegy)   Each maintenance medication was reviewed in detail including most importantly the difference between maintenance and prns and under what circumstances the prns  are to be triggered using an action plan format that is not reflected in the computer generated alphabetically organized AVS.    Please see AVS for specific instructions unique to this office visit that I personally wrote and verbalized to the the pt in detail and then reviewed with pt  by my nurse highlighting any changes in therapy/plan of care  recommended at today's visit.

## 2018-02-12 NOTE — Telephone Encounter (Signed)
Spoke with  Stephanie Baldwin and wants to know why she needs a CT Angiogram. Explained the test results.   Notes recorded by Nyoka Cowden, MD on 02/12/2018 at 9:46 AM EDT Call patient : Study is concerning for blood clots though probably a false positive - she needs a CTa anyway to figure out why her sats drop with activity so if bun/creat ok CTa

## 2018-02-12 NOTE — Assessment & Plan Note (Signed)
Body mass index is 42.91 kg/m.   Lab Results  Component Value Date   TSH 2.224 01/10/2018     Contributing to gerd risk/ doe/reviewed the need and the process to achieve and maintain neg calorie balance > defer f/u primary care including intermittently monitoring thyroid status

## 2018-02-12 NOTE — Telephone Encounter (Signed)
Spoke with Stephanie Baldwin and advised rx sent to pharmacy. Nothing further is needed.

## 2018-02-12 NOTE — Telephone Encounter (Signed)
MW can we refill her lasix until they can find a PCP. They reported that the hospital wrote this RX. Please advise.    Patient Instructions by Nyoka Cowden, MD at 02/11/2018 3:30 PM  Author: Nyoka Cowden, MD Author Type: Physician Filed: 02/11/2018 5:07 PM  Note Status: Addendum Cosign: Cosign Not Required Encounter Date: 02/11/2018  Editor: Nyoka Cowden, MD (Physician)  Prior Versions: 1. Nyoka Cowden, MD (Physician) at 02/11/2018 5:06 PM - Addendum   2. Nyoka Cowden, MD (Physician) at 02/11/2018 4:55 PM - Addendum   3. Nyoka Cowden, MD (Physician) at 02/11/2018 4:53 PM - Addendum   4. Nyoka Cowden, MD (Physician) at 02/11/2018 4:53 PM - Signed    Only use your albuterol as a rescue medication to be used if you can't catch your breath by resting or doing a relaxed purse lip breathing pattern.  - The less you use it, the better it will work when you need it. - Ok to use up to 2 puffs  every 4 hours if you must but call for immediate appointment if use goes up over your usual need - Don't leave home without it !!  (think of it like the spare tire for your car)    No need for 02 at rest but up to 4lpm with walking   Stop potassium and start aldactone 25  mg each am with your lasix   We will have [facility] do a best fit evaluation for your 02 (that should meet all your needs for ambulatory 02)    Please remember to go to the lab department downstairs in the basement  for your tests - we will call you with the results when they are available.      Please schedule a follow up office visit in 2 weeks, sooner if needed  with all medications /inhalers/ solutions in hand so we can verify exactly what you are taking. This includes all medications from all doctors and over the counters to see NP while we set you up with one of our sleep doctors

## 2018-02-12 NOTE — Assessment & Plan Note (Addendum)
Still has 1+ pitting and have not rules out chronic pe here with unexplained ongoing desats so next step is CTa if renal function allows or v/q if it doesn't and add aldactone to rx  = just start with 25 mg daily and recheck in 2 weeks with bmet

## 2018-02-12 NOTE — Telephone Encounter (Signed)
ok 

## 2018-02-12 NOTE — Progress Notes (Signed)
Spoke with pt and notified of results per Dr. Sherene Sires. Pt verbalized understanding and denied any questions. Awaiting BMET and then will order the CTA

## 2018-02-12 NOTE — Progress Notes (Signed)
Per MW- okay to go ahead and order CTA Order sent to [facility] stat

## 2018-02-12 NOTE — Progress Notes (Signed)
Pt.notified

## 2018-02-13 ENCOUNTER — Ambulatory Visit (HOSPITAL_BASED_OUTPATIENT_CLINIC_OR_DEPARTMENT_OTHER)
Admission: RE | Admit: 2018-02-13 | Discharge: 2018-02-13 | Disposition: A | Discharge status: Home / Self Care | Payer: Medicaid Other | Source: Ambulatory Visit | Attending: Internal Medicine | Admitting: Internal Medicine

## 2018-02-13 ENCOUNTER — Encounter (HOSPITAL_BASED_OUTPATIENT_CLINIC_OR_DEPARTMENT_OTHER): Payer: Self-pay

## 2018-02-13 ENCOUNTER — Encounter: Payer: Self-pay | Admitting: Internal Medicine

## 2018-02-13 DIAGNOSIS — R0602 Shortness of breath: Secondary | ICD-10-CM | POA: Insufficient documentation

## 2018-02-13 DIAGNOSIS — R0609 Other forms of dyspnea: Secondary | ICD-10-CM | POA: Insufficient documentation

## 2018-02-13 DIAGNOSIS — R911 Solitary pulmonary nodule: Secondary | ICD-10-CM | POA: Insufficient documentation

## 2018-02-13 MED ORDER — IOPAMIDOL (ISOVUE-370) INJECTION 76%
100.0000 mL | Freq: Once | INTRAVENOUS | Status: AC | PRN
  Administered 2018-02-13: 100 mL via INTRAVENOUS

## 2018-02-13 NOTE — Assessment & Plan Note (Signed)
4-5 min discussion re active cigarette smoking in addition to office E&M  Ask about tobacco use:  Says she has quit but remains to be seen Advise quitting   I reviewed the Fletcher curve with the patient that basically indicates  if you quit smoking when your best day FEV1 is still well preserved (as is clearly  the case here)  it is highly unlikely you will progress to severe disease and informed the patient there was  no medication on the market that has proven to alter the curve/ its downward trajectory  or the likelihood of progression of their disease(unlike other chronic medical conditions such as atheroclerosis where we do think we can change the natural hx with risk reducing meds)    Therefore stopping smoking and maintaining abstinence are  the most important aspects of care, not choice of inhalers or for that matter, doctors.  Treatment other than smoking cessation  is entirely directed by severity of symptoms and focused also on reducing exacerbations, not attempting to change the natural history of the disease.  No evidence of any copd/ab at present so ok to just use saba prn Assess willingness yes but having a hard time Assist in quit attempt per pcp Arrange follow up. Per pcp

## 2018-02-14 ENCOUNTER — Telehealth: Payer: Self-pay | Admitting: Internal Medicine

## 2018-02-14 ENCOUNTER — Encounter: Payer: Self-pay | Admitting: Internal Medicine

## 2018-02-14 DIAGNOSIS — R911 Solitary pulmonary nodule: Secondary | ICD-10-CM | POA: Insufficient documentation

## 2018-02-14 NOTE — Telephone Encounter (Signed)
Called and spoke with patient, advised of results. Nothing further needed. Verbalized understanding.

## 2018-02-14 NOTE — Progress Notes (Signed)
LMTCB

## 2018-02-19 ENCOUNTER — Telehealth: Payer: Self-pay | Admitting: Internal Medicine

## 2018-02-19 NOTE — Telephone Encounter (Signed)
Left voice mail for Victorino Dike about plan of care.  Plan of care faxed this morning 02/19/18 to [facility].

## 2018-02-27 ENCOUNTER — Ambulatory Visit (INDEPENDENT_AMBULATORY_CARE_PROVIDER_SITE_OTHER): Payer: Medicaid Other | Admitting: Gastroenterology

## 2018-02-27 ENCOUNTER — Encounter: Payer: Self-pay | Admitting: Gastroenterology

## 2018-02-27 VITALS — BP 122/78 | HR 84 | Ht 62.0 in | Wt 238.5 lb

## 2018-02-27 DIAGNOSIS — K769 Liver disease, unspecified: Secondary | ICD-10-CM

## 2018-02-27 DIAGNOSIS — K7581 Nonalcoholic steatohepatitis (NASH): Secondary | ICD-10-CM | POA: Diagnosis not present

## 2018-02-27 NOTE — Patient Instructions (Addendum)
If you are age 57 or older, your body mass index should be between 23-30. Your Body mass index is 43.62 kg/m. If this is out of the aforementioned range listed, please consider follow up with your Primary Care Provider.  If you are age 64 or younger, your body mass index should be between 19-25. Your Body mass index is 43.62 kg/m. If this is out of the aformentioned range listed, please consider follow up with your Primary Care Provider.     You have been scheduled for a CT scan of the abdomen and pelvis at [facility]   You are scheduled on 03/06/18 at 10am. You should arrive 15 minutes prior to your appointment time for registration. Please follow the written instructions below on the day of your exam:  WARNING: IF YOU ARE ALLERGIC TO IODINE/X-RAY DYE, PLEASE NOTIFY RADIOLOGY IMMEDIATELY AT [phone]! YOU WILL BE GIVEN A 13 HOUR PREMEDICATION PREP.  1) Do not eat or drink anything after 4:00am (4 hours prior to your test) 2) You have been given 2 bottles of oral contrast to drink. The solution may taste better if refrigerated, but do NOT add ice or any other liquid to this solution. Shake well before drinking.    Drink 1 bottle of contrast @ 8am (2 hours prior to your exam)  Drink 1 bottle of contrast @ 9am (1 hour prior to your exam)  You may take any medications as prescribed with a small amount of water except for the following: Metformin, Glucophage, Glucovance, Avandamet, Riomet, Fortamet, Actoplus Met, Janumet, Glumetza or Metaglip. The above medications must be held the day of the exam AND 48 hours after the exam.  The purpose of you drinking the oral contrast is to aid in the visualization of your intestinal tract. The contrast solution may cause some diarrhea. Before your exam is started, you will be given a small amount of fluid to drink. Depending on your individual set of symptoms, you may also receive an intravenous injection of x-ray contrast/dye. Plan on being at  [facility] for 30 minutes or longer, depending on the type of exam you are having performed.  This test typically takes 30-45 minutes to complete.  If you have any questions regarding your exam or if you need to reschedule, you may call the CT department at [phone] between the hours of 8:00 am and 5:00 pm, Monday-Friday.  ________________________________________________________________________   Thank you,  Dr. Rajesh Gupta   

## 2018-02-27 NOTE — Progress Notes (Signed)
Chief Complaint: Chronic liver disease on ultrasound  Referring Provider:  Kandice Hams, MD      ASSESSMENT AND PLAN;   #1. Chronic Liver Disease (on [place]), negative acute hep panel. 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  No clinical or biochemical evidence of portal hypertension. Nl flow on Doppler. Patient has borderline low platelets. Nl albumin and PT. No ETOH. - Proceed with CT scan of the abdomen and pelvis with p.o. and IV contrast in the next 3 to 4 weeks. - Reduce weight.  I have encouraged patient to exercise as much as she can.  Watch p.o. Intake.  Stop fried foods.  Stop all sodas. - FU in 3 months. At FU, recheck CBC,LFTs, NH3 and PT. Consider vaccines. -No indication for liver biopsy at the present time.  #2. COPD/RLD with morbid obesity, OSA on BIPAP, continued smoking -being followed by pulmonary.   HPI:    Stephanie Baldwin is a 57 y.o. female  Sent to the GI clinic for abdominal ultrasound Patient with recent respiratory failure requiring intubation, hyponatremia, fluid overload.  Doing very well currently She denies having any GI complaints. No nausea, vomiting, heartburn, regurgitation, odynophagia or dysphagia.  No significant diarrhea or constipation.  There is no melena or hematochezia. No unintentional weight loss. No history of itching, skin lesions, easy bruisability, intake of over-the-counter medications including diet pills, herbal medications, anabolic steroids or Tylenol.  There is no history of blood transfusions, IV drug use or family history of liver disease.  No jaundice dark urine or pale stools.  No history of alcohol abuse. Liver function tests are detailed below   Past Medical History:  Diagnosis Date  . Adrenal insufficiency ([place]) 01/16/2018  . Agoraphobia   . Depression   . Obesity, Class III, BMI 40-49.9 (morbid obesity) ([place]) 01/16/2018  . OSA (obstructive sleep apnea) 01/16/2018  . Tobacco abuse     History reviewed. No pertinent surgical  history.  Family History  Problem Relation Age of Onset  . Cancer Mother   . Diabetes Mother   . Heart disease Mother   . Hypothyroidism Mother   . Stroke Mother   . Cancer Father     Social History   Tobacco Use  . Smoking status: Current Every Day Smoker    Packs/day: 1.00    Types: Cigarettes  . Smokeless tobacco: Never Used  . Tobacco comment: quit smoking april 9th, 2019.   Substance Use Topics  . Alcohol use: Never    Frequency: Never  . Drug use: Never    Current Outpatient Medications  Medication Sig Dispense Refill  . albuterol (PROVENTIL HFA;VENTOLIN HFA) 108 (90 Base) MCG/ACT inhaler Inhale 2 puffs into the lungs every 6 (six) hours as needed for wheezing or shortness of breath. Please instruct in proper MDI technique. 1 Inhaler 0  . furosemide (LASIX) 20 MG tablet Take 1 tablet (20 mg total) by mouth daily. 30 tablet 0  . nicotine (NICODERM CQ - DOSED IN MG/24 HOURS) 14 mg/24hr patch Place 1 patch (14 mg total) onto the skin daily. 30 patch 0  . spironolactone (ALDACTONE) 25 MG tablet Take 1 tablet (25 mg total) by mouth daily. 30 tablet 11   No current facility-administered medications for this visit.     Allergies  Allergen Reactions  . Aspirin   . Guaifenesin & Derivatives   . Paxil [Paroxetine]   . Penicillins   . Sulfa Antibiotics     Review of Systems:  Constitutional: Denies fever, chills,  diaphoresis, appetite change and fatigue.  HEENT: Denies photophobia, eye pain, redness, hearing loss, ear pain, congestion, sore throat, rhinorrhea, sneezing, mouth sores, neck pain, neck stiffness and tinnitus.   Respiratory: Denies SOB, DOE, cough, chest tightness,  and wheezing.  Had SOB previously Cardiovascular: Denies chest pain, palpitations and leg swelling.  Genitourinary: Denies dysuria, urgency, frequency, hematuria, flank pain and difficulty urinating.  Musculoskeletal: Denies myalgias, back pain, joint swelling, arthralgias and gait problem.    Skin: No rash.  Neurological: Denies dizziness, seizures, syncope, weakness, light-headedness, numbness and headaches.  Hematological: Denies adenopathy. Easy bruising, personal or family bleeding history  Psychiatric/Behavioral: Has anxiety or depression     Physical Exam:    BP 122/78   Pulse 84   Ht '5\' 2"'  (1.575 m)   Wt 238 lb 8 oz (108.2 kg)   BMI 43.62 kg/m  Filed Weights   02/27/18 1430  Weight: 238 lb 8 oz (108.2 kg)   Constitutional:  Well-developed, in no acute distress. Psychiatric: Normal mood and affect. Behavior is normal. HEENT: Pupils normal.  Conjunctivae are normal. No scleral icterus. Neck supple.  Cardiovascular: Normal rate, regular rhythm. No edema Pulmonary/chest: Effort normal and breath sounds normal. No wheezing, rales or rhonchi. Abdominal: Soft, nondistended. Nontender. Bowel sounds active throughout. There are no masses palpable. No hepatomegaly. Rectal:  defered Neurological: Alert and oriented to person place and time. Skin: Skin is warm and dry. No rashes noted.  Data Reviewed: I have personally reviewed following labs and imaging studies  CBC: CBC Latest Ref Rng & Units 02/11/2018 01/17/2018 01/16/2018  WBC 4.0 - 10.5 K/uL 11.6(H) 11.2(H) 9.2  Hemoglobin 12.0 - 15.0 g/dL 15.6(H) 17.5(H) 18.0(H)  Hematocrit 36.0 - 46.0 % 47.3(H) 55.4(H) 55.6(H)  Platelets 150.0 - 400.0 K/uL 144.0(L) 117(L) 107(L)    CMP: CMP Latest Ref Rng & Units 02/11/2018 01/31/2018 01/16/2018  Glucose 70 - 99 mg/dL 129(H) 115(H) 121(H)  BUN 6 - 23 mg/dL 3(L) 3(L) 13  Creatinine 0.40 - 1.20 mg/dL 0.61 0.52(L) 0.56  Sodium 135 - 145 mEq/L 140 143 132(L)  Potassium 3.5 - 5.1 mEq/L 4.3 4.6 4.2  Chloride 96 - 112 mEq/L 101 102 89(L)  CO2 19 - 32 mEq/L 31 27 35(H)  Calcium 8.4 - 10.5 mg/dL 9.7 10.1 9.3  Total Protein 6.5 - 8.1 g/dL - - -  Total Bilirubin 0.3 - 1.2 mg/dL - - -  Alkaline Phos 38 - 126 U/L - - -  AST 15 - 41 U/L - - -  ALT 14 - 54 U/L - - -   Hepatic  Function Latest Ref Rng & Units 01/15/2018 01/10/2018 01/09/2018  Total Protein 6.5 - 8.1 g/dL 6.6 5.5(L) 5.6(L)  Albumin 3.5 - 5.0 g/dL 3.7 3.1(L) 3.3(L)  AST 15 - 41 U/L '27 27 27  ' ALT 14 - 54 U/L '25 18 20  ' Alk Phosphatase 38 - 126 U/L 62 63 60  Total Bilirubin 0.3 - 1.2 mg/dL 1.7(H) 1.4(H) 1.7(H)  Bilirubin, Direct 0.1 - 0.5 mg/dL - 0.5 -     Radiology Studies: Ct Angio Chest W/cm &/or Wo Cm  Result Date: 02/13/2018 CLINICAL DATA:  Shortness of breath EXAM: CT ANGIOGRAPHY CHEST WITH CONTRAST TECHNIQUE: Multidetector CT imaging of the chest was performed using the standard protocol during bolus administration of intravenous contrast. Multiplanar CT image reconstructions and MIPs were obtained to evaluate the vascular anatomy. CONTRAST:  120m ISOVUE-370 IOPAMIDOL (ISOVUE-370) INJECTION 76% COMPARISON:  Oval chest x-ray of 01/12/2018 FINDINGS: Cardiovascular: The pulmonary arteries  are well opacified. There is no evidence of acute pulmonary embolism. The heart is within upper limits of normal. No pericardial effusion is seen. No definite coronary artery calcifications are noted. Mediastinum/Nodes: No mediastinal or hilar adenopathy is seen. Calcified left hilar and mediastinal nodes are present consistent with prior granulomatous disease. The thyroid gland is enlarged on the right with a complex low-attenuation structure of 1.9 cm on the right. Recommend ultrasound of the thyroid to assess further. No hiatal hernia is seen. Lungs/Pleura: A calcified granuloma is present anteriorly within the lingula consistent with prior granulomatous disease with calcified left hilar and mediastinal nodes as well. Somewhat prominent markings are noted throughout the lungs which appear chronic with no active infiltrate or pleural effusion currently noted. The central airway is patent. A 6 mm noncalcified pleural-based nodule is present in the right lower lobe on image 59 series 5. Non-contrast chest CT at 6-12 months is  recommended. If the nodule is stable at time of repeat CT, then future CT at 18-24 months (from today's scan) is considered optional for low-risk patients, but is recommended for high-risk patients. This recommendation follows the consensus statement: Guidelines for Management of Incidental Pulmonary Nodules Detected on CT Images: From the Fleischner Society 2017; Radiology 2017; 284:228-243. Upper Abdomen: Within the upper abdomen, calcified splenic granulomas are noted from prior granulomatous disease. No other abnormality is evident on the few images obtained. Musculoskeletal: The thoracic vertebrae are in normal alignment with mild degenerative changes present. Review of the MIP images confirms the above findings. IMPRESSION: 1. No evidence of acute pulmonary embolism. No acute abnormality of the thoracic aorta. 2. Changes of prior granulomatous disease as noted above. 3. Single 6 mm noncalcified pleural-based nodule in the right lower lobe. Non-contrast chest CT at 6-12 months is recommended. If the nodule is stable at time of repeat CT, then future CT at 18-24 months (from today's scan) is considered optional for low-risk patients, but is recommended for high-risk patients. This recommendation follows the consensus statement: Guidelines for Management of Incidental Pulmonary Nodules Detected on CT Images: From the Fleischner Society 2017; Radiology 2017; 284:228-243. 4. Complex low-attenuation nodule in the right lobe of thyroid of 1.9 cm. Recommend thyroid ultrasound to evaluate further. Electronically Signed   By: Ivar Drape M.D.   On: 02/13/2018 16:28   CLINICAL DATA:  57 year old female with cirrhosis.  Hyponatremia.  EXAM: ABDOMEN ULTRASOUND COMPLETE  COMPARISON:  Portable abdomen radiograph 01/09/2018.  FINDINGS: Gallbladder: No gallstones or wall thickening visualized. No sonographic Murphy sign noted by sonographer.  Common bile duct: Diameter: 2 millimeters, normal  Liver: Coarse  hepatic echotexture, with a degree of suspected diffuse increased echogenicity. No discrete liver lesion or biliary ductal dilatation is identified. Portal vein is patent on color Doppler imaging with normal direction of blood flow towards the liver (image 41).  IVC: No abnormality visualized.  Pancreas: The pancreatic head and neck are identified with questionable partial atrophy (image 51).  Spleen: No splenomegaly, estimated splenic length 6.4 centimeters.  Right Kidney: Length: 12.8 centimeters. Echogenicity within normal limits. No mass or hydronephrosis visualized.  Left Kidney: Length: Size estimated at 12.5 centimeters, the left kidney is not as well visualized as the right. No definite left hydronephrosis.  Abdominal aorta: Incompletely visualized due to overlying bowel gas, visualized portions within normal limits.  Other findings: Possible right pleural effusion, uncertain.  IMPRESSION: 1. No acute findings identified in the abdomen by ultrasound. 2. Coarse liver echotexture compatible with chronic liver disease. Possible hepatic  steatosis. No discrete liver lesion identified   Carmell Austria, MD 02/27/2018, 2:41 PM  Cc: Kandice Hams, MD

## 2018-03-01 ENCOUNTER — Telehealth: Payer: Self-pay | Admitting: Internal Medicine

## 2018-03-01 NOTE — Telephone Encounter (Signed)
Left detailed message for Stephanie Baldwin stating that the forms have been refaxed to the number listed above.   Will close this encounter.

## 2018-03-06 ENCOUNTER — Other Ambulatory Visit (HOSPITAL_BASED_OUTPATIENT_CLINIC_OR_DEPARTMENT_OTHER): Payer: Medicaid Other

## 2018-03-08 ENCOUNTER — Ambulatory Visit (INDEPENDENT_AMBULATORY_CARE_PROVIDER_SITE_OTHER): Payer: Medicaid Other | Admitting: Internal Medicine

## 2018-03-08 ENCOUNTER — Encounter: Payer: Self-pay | Admitting: Internal Medicine

## 2018-03-08 ENCOUNTER — Other Ambulatory Visit (INDEPENDENT_AMBULATORY_CARE_PROVIDER_SITE_OTHER): Payer: Medicaid Other

## 2018-03-08 VITALS — BP 126/88 | HR 121 | Ht 62.0 in | Wt 237.6 lb

## 2018-03-08 DIAGNOSIS — J9612 Chronic respiratory failure with hypercapnia: Secondary | ICD-10-CM | POA: Diagnosis not present

## 2018-03-08 DIAGNOSIS — R0609 Other forms of dyspnea: Secondary | ICD-10-CM | POA: Diagnosis not present

## 2018-03-08 DIAGNOSIS — I2781 Cor pulmonale (chronic): Secondary | ICD-10-CM

## 2018-03-08 DIAGNOSIS — E66813 Obesity, class 3: Secondary | ICD-10-CM

## 2018-03-08 DIAGNOSIS — R058 Other specified cough: Secondary | ICD-10-CM

## 2018-03-08 DIAGNOSIS — R05 Cough: Secondary | ICD-10-CM

## 2018-03-08 DIAGNOSIS — J9611 Chronic respiratory failure with hypoxia: Secondary | ICD-10-CM

## 2018-03-08 DIAGNOSIS — R911 Solitary pulmonary nodule: Secondary | ICD-10-CM | POA: Diagnosis not present

## 2018-03-08 DIAGNOSIS — M7989 Other specified soft tissue disorders: Secondary | ICD-10-CM

## 2018-03-08 LAB — CBC WITH DIFFERENTIAL/PLATELET
BASOS PCT: 0.9 % (ref 0.0–3.0)
Basophils Absolute: 0.1 10*3/uL (ref 0.0–0.1)
EOS PCT: 0.5 % (ref 0.0–5.0)
Eosinophils Absolute: 0.1 10*3/uL (ref 0.0–0.7)
HCT: 44.8 % (ref 36.0–46.0)
Hemoglobin: 14.5 g/dL (ref 12.0–15.0)
LYMPHS ABS: 4.1 10*3/uL — AB (ref 0.7–4.0)
Lymphocytes Relative: 34.4 % (ref 12.0–46.0)
MCHC: 32.4 g/dL (ref 30.0–36.0)
MCV: 86.6 fl (ref 78.0–100.0)
MONO ABS: 0.6 10*3/uL (ref 0.1–1.0)
MONOS PCT: 4.9 % (ref 3.0–12.0)
NEUTROS ABS: 7.1 10*3/uL (ref 1.4–7.7)
NEUTROS PCT: 59.3 % (ref 43.0–77.0)
Platelets: 181 10*3/uL (ref 150.0–400.0)
RBC: 5.17 Mil/uL — AB (ref 3.87–5.11)
RDW: 18 % — AB (ref 11.5–15.5)
WBC: 12 10*3/uL — ABNORMAL HIGH (ref 4.0–10.5)

## 2018-03-08 LAB — BASIC METABOLIC PANEL
BUN: 4 mg/dL — ABNORMAL LOW (ref 6–23)
CALCIUM: 9.6 mg/dL (ref 8.4–10.5)
CO2: 28 meq/L (ref 19–32)
Chloride: 101 mEq/L (ref 96–112)
Creatinine, Ser: 0.62 mg/dL (ref 0.40–1.20)
GFR: 105.66 mL/min (ref >60.00)
Glucose, Bld: 136 mg/dL — ABNORMAL HIGH (ref 70–99)
Potassium: 4.1 mEq/L (ref 3.5–5.1)
SODIUM: 138 meq/L (ref 135–145)

## 2018-03-08 NOTE — Progress Notes (Signed)
Subjective:     Patient ID: Stephanie Baldwin, female   DOB: [birth year],    MRN: [id number]    Brief patient profile:  [id number] yowf  Quit smoking April 9th 2019    Since around 1989 has noted every fall   with sense of pnds that prevents her from lying < 30 degrees  chest congestion/ loss of voice treat otcs which resolves completely by late fall but Oct 2017 came stayed and worse again Oct 2018 had another flare fall then by March 2019 much worse with cough/ gag/ vomit then admitted    Admit date: 01/09/2018 Discharge date: 01/17/2018  Recommendations for Outpatient Follow-up:  Acute mixed hypercapnic, hypoxic respiratory failure with associated acute encephalopathy. Acute encephalopathy resolved. Suspected underlying obstructive sleep apnea, some degree of obesity hypoventilation syndrome, COPD. --Continues to do well.Follow-up with pulmonology/sleep medicine as an outpatient has been arranged. --BiPAP per PCCM:BiPAP at night, BiPAP set at 20 EPAP at 8. 5 L oxygen to be bled in.Backup rate of 12.Goal target saturated station 88-92%   Acute pulmonary edema secondary to IV fluids, possible acute diastolic heart failure.  --[facility] has been arranged.  Possible chronic liver disease. Outpatient GI evaluation has been arranged with [place] clinic.  Adrenal insufficiency. Random cortisol low at 1.9.  Decadron taper. Consider outpatient cortisol stim test.  Thrombocytopenia --Stable.  Secondary to acute illness.    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  Cigarette smoker --Recommend cessation     Since d/c from [facility] has been placed on Trelegy / 02 every noct and able to sleep but only up 30 degrees > Felt  back to nl when she is able to use the trelegy effectively   02/11/2018 1st [facility] office visit/ Zinnia Tindall   Chief Complaint  Patient presents with  . Consult    COPD. ER  visit 4/10, has panic attacks with Trelegy   Doe now = MMRC3 = can't walk 100 yards even at a slow pace at a flat grade s stopping due to sob  Even on 02  Very limited insight into medical issues/ 02 rx  Feels the trelegy is choking her at times and gives her problems with mask but using it most of the time  No better on saba  rec Only use your albuterol as a rescue medication  No need for 02 at rest but up to 4lpm with walking  Stop potassium and start aldactone 25  mg each am with your lasix  We will have [facility] do a best fit evaluation for your 02 (that should meet all your needs for ambulatory 02    03/08/2018  f/u ov/Jaishaun Mcnab re:  Chief Complaint  Patient presents with  . Follow-up    Breathing is unchanged. She c/o chest congestion "it's been building up for weeks". She is coughing with white sputum.  She has not had to use her albuterol inhaler.   Dyspnea: still = MMRC3 = can't walk 100 yards even at a slow pace at a flat grade s stopping due to sob even with 02 up 4lpm  Cough: min white es in am  Sleep: 30 degrees x years, can't tol trelegy / using  4lpm  SABA use:  none    No obvious day to day or daytime variability or assoc excess/ purulent sputum or mucus plugs or hemoptysis or cp or chest tightness, subjective wheeze or overt sinus or hb symptoms. No unusual exposure hx  or h/o childhood pna/ asthma or knowledge of premature birth.  Sleeping  As above   without nocturnal  or early am exacerbation  of respiratory  c/o's or need for noct saba. Also denies any obvious fluctuation of symptoms with weather or environmental changes or other aggravating or alleviating factors except as outlined above   Current Allergies, Complete Past Medical History, Past Surgical History, Family History, and Social History were reviewed in [employer] record.  ROS  The following are not active complaints unless bolded Hoarseness, sore throat, dysphagia, dental problems, itching,  sneezing,  nasal congestion or discharge of excess mucus or purulent secretions, ear ache,   fever, chills, sweats, unintended wt loss or wt gain, classically pleuritic or exertional cp,  orthopnea pnd or arm/hand swelling  or leg swelling L>R x years, some better , presyncope, palpitations, abdominal pain, anorexia, nausea, vomiting, diarrhea  or change in bowel habits or change in bladder habits, change in stools or change in urine, dysuria, hematuria,  rash, arthralgias, visual complaints, headache, numbness, weakness or ataxia or problems with walking or coordination,  change in mood or  memory.        Current Meds  Medication Sig  . acetaminophen (TYLENOL) 325 MG tablet Take 650 mg by mouth every 6 (six) hours as needed.  Marland Kitchen albuterol (PROVENTIL HFA;VENTOLIN HFA) 108 (90 Base) MCG/ACT inhaler Inhale 2 puffs into the lungs every 6 (six) hours as needed for wheezing or shortness of breath. Please instruct in proper MDI technique.  . furosemide (LASIX) 20 MG tablet Take 1 tablet (20 mg total) by mouth daily.  Marland Kitchen spironolactone (ALDACTONE) 25 MG tablet Take 1 tablet (25 mg total) by mouth daily.                Objective:   Physical Exam  Obese wf arrived right at closing time late for her appt   03/08/2018          237   02/11/18 234 lb 9.6 oz (106.4 kg)  02/05/18 235 lb (106.6 kg)  01/31/18 235 lb (106.6 kg)    Vital signs reviewed - Note on arrival 02 sats  91% on RA and Pulse 121 p rushing to get in        HEENT: nl dentition, turbinates bilaterally, and oropharynx. Nl external ear canals without cough reflex - Modified Mallampati Score =   3    NECK :  without JVD/Nodes/TM/ nl carotid upstrokes bilaterally   LUNGS: no acc muscle use,  Nl contour chest which is clear to A and P bilaterally without cough on insp or exp maneuvers   CV:  RRR  no s3 or murmur or increase in P2, and pitting edema both legs L > R "always that way"  ABD:  soft and nontender with nl inspiratory  excursion in the supine position. No bruits or organomegaly appreciated, bowel sounds nl  MS:  Nl gait/ ext warm without deformities, calf tenderness, cyanosis or clubbing No obvious joint restrictions   SKIN: warm and dry without lesions    NEURO:  alert, somber/  nl sensorium with  no motor or cerebellar deficits apparent.    Labs ordered/ reviewed:      Chemistry      Component Value Date/Time   NA 138 03/08/2018 1725   NA 143 01/31/2018 1105   K 4.1 03/08/2018 1725   CL 101 03/08/2018 1725   CO2 28 03/08/2018 1725   BUN 4 (L) 03/08/2018 1725  BUN 3 (L) 01/31/2018 1105   CREATININE 0.62 03/08/2018 1725      Component Value Date/Time   CALCIUM 9.6 03/08/2018 1725   ALKPHOS 62 01/15/2018 0406   AST 27 01/15/2018 0406   ALT 25 01/15/2018 0406   BILITOT 1.7 (H) 01/15/2018 0406        Lab Results  Component Value Date   WBC 12.0 (H) 03/08/2018   HGB 14.5 03/08/2018   HCT 44.8 03/08/2018   MCV 86.6 03/08/2018   PLT 181.0 03/08/2018                  Assessment:

## 2018-03-08 NOTE — Patient Instructions (Addendum)
Change your aldactone to 25 mg twice daily along with your lasix twice daily   Please remember to go to the lab department downstairs in the basement  for your tests - we will call you with the results when they are available.      We will arrange for a sleep study asap and get your referred to Dr Craige CottaSood   We will also be calling to arrange venous dopplers   F/u with NP in 2 weeks for reccheck

## 2018-03-09 ENCOUNTER — Encounter: Payer: Self-pay | Admitting: Internal Medicine

## 2018-03-09 DIAGNOSIS — M7989 Other specified soft tissue disorders: Secondary | ICD-10-CM | POA: Insufficient documentation

## 2018-03-09 NOTE — Assessment & Plan Note (Addendum)
Body mass index is 43.46 kg/m.  -  trending up despite diuresis Lab Results  Component Value Date   TSH 2.224 01/10/2018     Contributing to osa/ doe/reviewed the need and the process to achieve and maintain neg calorie balance > defer f/u primary care including intermittently monitoring thyroid status

## 2018-03-09 NOTE — Assessment & Plan Note (Addendum)
Chronic but has pos d dimer so needs venous dopplers and in meantime increase ald/lasix as above   I had an extended discussion with the patient reviewing all relevant studies completed to date and  lasting 25 minutes of a 40  minute office visit addressing many  severe non-specific but potentially very serious medical issues with respiratory symptoms of uncertain and potentially multiple  etiologies.   Each maintenance medication was reviewed in detail including most importantly the difference between maintenance and prns and under what circumstances the prns are to be triggered using an action plan format that is not reflected in the computer generated alphabetically organized AVS.    Please see AVS for specific instructions unique to this office visit that I personally wrote and verbalized to the the pt in detail and then reviewed with pt  by my nurse highlighting any changes in therapy/plan of care  recommended at today's visit.

## 2018-03-09 NOTE — Assessment & Plan Note (Signed)
CTa pos 6 mm nodule rll/ smoker > needs f/u in 6 months (reminder file)  CT results reviewed with pt >>> Too small for PET or bx, not suspicious enough for excisional bx > really only option for now is follow the Fleischner society guidelines as rec by radiology = 6 month study planned   Discussed in detail all the  indications, usual  risks and alternatives  relative to the benefits with patient who agrees to proceed with w/u as outlined.

## 2018-03-09 NOTE — Assessment & Plan Note (Addendum)
Allergy profile 03/08/2018 >  Eos 0. /  IgE  Ordered  In meantime can try zyrtec prn otc

## 2018-03-09 NOTE — Assessment & Plan Note (Signed)
Increased aldactone to 25 bid and lasix to 20mg  bid then increase lasix to 40 bid if tolerates based on bp/bun/creat K at next ov in 2 weeks

## 2018-03-09 NOTE — Assessment & Plan Note (Signed)
--  BiPAP per PCCM:BiPAP at night, BiPAP set at 20 EPAP at 8. 5 L oxygen to be bled in.Backup rate of 12.Goal target saturated station > 90%  - HCO3 02/11/2018  = 31 (down from admit)  - 02/11/2018   Walked RA x one lap @ 185 stopped due to  Sob with sats 85%> corrected on 4lpm so may not be candidate for POC > referred for best fit  - HC03 28 despite off trelegy 03/08/2018   No need for 02 at rest but 4lpm hs and 4lpm with activity for now and rec wear the trelegy as much as she can pending sleep study

## 2018-03-09 NOTE — Assessment & Plan Note (Signed)
Echo 01/10/18  Left ventricle: The cavity size was normal. Wall thickness was   increased in a pattern of mild LVH. Systolic function was normal.   The estimated ejection fraction was in the range of 60% to 65%.   Although no diagnostic regional wall motion abnormality was   identified, this possibility cannot be completely excluded on the   basis of this study. - Left atrium: The atrium was mildly to moderately dilated. - Right atrium: The atrium was moderately dilated. 02/06/18  myoview nl  02/11/2018   Walked RA x one lap @ 185 stopped due to  Sob with sats 85% > see chronic resp failure  - Spirometry 02/11/2018  FEV1 1.61 (65%)  Ratio 75  - CTa 02/13/2018 >>> neg pe   Most likely due to restrictive changes from obesity with ? Element of cor pulmonale from longstanding hypoxemia and osa but no evidence of copd/ AB at this point so needs  1) increase ald/lasix to bid  2) continued paced activity with 02 3[facility] [place] where trelegy as much as tolerates

## 2018-03-11 ENCOUNTER — Telehealth: Payer: Self-pay | Admitting: Internal Medicine

## 2018-03-11 ENCOUNTER — Encounter (HOSPITAL_COMMUNITY): Payer: Medicaid Other

## 2018-03-11 DIAGNOSIS — R0609 Other forms of dyspnea: Principal | ICD-10-CM

## 2018-03-11 LAB — RESPIRATORY ALLERGY PROFILE REGION II ~~LOC~~
Allergen, A. alternata, m6: <0.1 kU/L
Allergen, Comm Silver Birch, t9: <0.1 kU/L
Allergen, D pternoyssinus,d7: 0.31 kU/L — ABNORMAL HIGH
Allergen, P. notatum, m1: <0.1 kU/L
Box Elder IgE: <0.1 kU/L
CLASS: 0
CLASS: 0
CLASS: 0
CLASS: 0
CLASS: 0
CLASS: 0
CLASS: 0
CLASS: 0
COCKROACH: 0.12 kU/L — AB
Class: 0
Class: 0
Class: 0
Class: 0
Class: 0
Class: 0
Class: 0
Class: 0
Class: 0
Class: 0
Class: 0
Class: 0
Class: 0
Class: 0
Class: 0
Class: 0
D. farinae: 0.29 kU/L — ABNORMAL HIGH
Elm IgE: <0.1 kU/L
IgE (Immunoglobulin E), Serum: 174 kU/L — ABNORMAL HIGH (ref <=114)
Johnson Grass: <0.1 kU/L
Pecan/Hickory Tree IgE: <0.1 kU/L

## 2018-03-11 LAB — INTERPRETATION:

## 2018-03-11 MED ORDER — FUROSEMIDE 20 MG PO TABS: 20.0000 mg | ORAL_TABLET | Freq: Two times a day (BID) | ORAL | 2 refills | Status: DC

## 2018-03-11 MED ORDER — SPIRONOLACTONE 25 MG PO TABS: 25.0000 mg | ORAL_TABLET | Freq: Two times a day (BID) | ORAL | 2 refills | Status: DC

## 2018-03-11 NOTE — Telephone Encounter (Signed)
Per last AVS:   Change your aldactone to 25 mg twice daily along with your lasix twice daily   Please remember to go to the lab department downstairs in the basement  for your tests - we will call you with the results when they are available.      We will arrange for a sleep study asap and get your referred to Dr Craige CottaSood   We will also be calling to arrange venous dopplers   F/u with NP in 2 weeks for reccheck   I have refilled aldactone and lasix to allow her to take as MW as rec  Spoke with pt's daughter and notified this was done

## 2018-03-11 NOTE — Telephone Encounter (Signed)
Returned Patient's daughter Karen Chafe(Jessi) call.  Results and recommendations given.  Jessi stated understanding and spoke back medication recommendations.  Nothing further at this time.

## 2018-03-12 ENCOUNTER — Encounter (HOSPITAL_BASED_OUTPATIENT_CLINIC_OR_DEPARTMENT_OTHER): Payer: Self-pay

## 2018-03-12 ENCOUNTER — Telehealth: Payer: Self-pay | Admitting: *Deleted

## 2018-03-12 ENCOUNTER — Ambulatory Visit (HOSPITAL_BASED_OUTPATIENT_CLINIC_OR_DEPARTMENT_OTHER)
Admission: RE | Admit: 2018-03-12 | Discharge: 2018-03-12 | Disposition: A | Discharge status: Home / Self Care | Payer: Medicaid Other | Source: Ambulatory Visit | Attending: Gastroenterology | Admitting: Gastroenterology

## 2018-03-12 DIAGNOSIS — I700 Atherosclerosis of aorta: Secondary | ICD-10-CM | POA: Insufficient documentation

## 2018-03-12 DIAGNOSIS — R911 Solitary pulmonary nodule: Secondary | ICD-10-CM | POA: Diagnosis not present

## 2018-03-12 DIAGNOSIS — K760 Fatty (change of) liver, not elsewhere classified: Secondary | ICD-10-CM | POA: Insufficient documentation

## 2018-03-12 DIAGNOSIS — K7581 Nonalcoholic steatohepatitis (NASH): Secondary | ICD-10-CM

## 2018-03-12 DIAGNOSIS — K769 Liver disease, unspecified: Secondary | ICD-10-CM | POA: Insufficient documentation

## 2018-03-12 MED ORDER — IOPAMIDOL (ISOVUE-300) INJECTION 61%
100.0000 mL | Freq: Once | INTRAVENOUS | Status: AC | PRN
  Administered 2018-03-12: 100 mL via INTRAVENOUS

## 2018-03-12 NOTE — Telephone Encounter (Signed)
Spoke with pt and advised per MW needs 2 wk ov with NP and sleep eval with VS She states she is unsure when she will be able to come back in  She will call back when she is able to schedule both appts

## 2018-03-12 NOTE — Telephone Encounter (Signed)
-----   Message from Nyoka CowdenMichael B Wert, MD sent at 03/09/2018  6:38 AM EDT ----- Needs appt with sood next avail consult osa

## 2018-03-13 ENCOUNTER — Ambulatory Visit (HOSPITAL_COMMUNITY): Payer: Medicaid Other

## 2018-03-13 ENCOUNTER — Telehealth: Payer: Self-pay

## 2018-03-13 NOTE — Telephone Encounter (Signed)
I gave the CT scan results to her daughter Shanda BumpsJessica.

## 2018-03-27 ENCOUNTER — Ambulatory Visit: Payer: Medicaid Other | Admitting: Family Medicine

## 2018-03-28 ENCOUNTER — Other Ambulatory Visit: Payer: Self-pay | Admitting: Internal Medicine

## 2018-03-28 MED ORDER — NICOTINE 14 MG/24HR TD PT24: 14.0000 mg | MEDICATED_PATCH | Freq: Every day | TRANSDERMAL | 0 refills | Status: DC

## 2018-03-29 ENCOUNTER — Ambulatory Visit (HOSPITAL_COMMUNITY)
Admission: RE | Admit: 2018-03-29 | Discharge: 2018-03-29 | Disposition: A | Discharge status: Home / Self Care | Payer: Medicaid Other | Source: Ambulatory Visit | Attending: Cardiovascular Disease | Admitting: Cardiovascular Disease

## 2018-03-29 DIAGNOSIS — M7989 Other specified soft tissue disorders: Secondary | ICD-10-CM | POA: Diagnosis not present

## 2018-04-01 ENCOUNTER — Encounter (HOSPITAL_BASED_OUTPATIENT_CLINIC_OR_DEPARTMENT_OTHER): Payer: Medicaid Other

## 2018-04-01 DIAGNOSIS — J449 Chronic obstructive pulmonary disease, unspecified: Secondary | ICD-10-CM | POA: Diagnosis not present

## 2018-04-01 DIAGNOSIS — J9612 Chronic respiratory failure with hypercapnia: Secondary | ICD-10-CM | POA: Diagnosis not present

## 2018-04-01 NOTE — Progress Notes (Signed)
Spoke with pt's daughter Shanda BumpsJessica ok per DPR and notified of results/recs  She verbalized understanding and will inform the pt

## 2018-04-12 IMAGING — US US ABDOMEN COMPLETE
1 series · 13 of 25 positions shown · non-contrast
Comparison: Portable abdomen radiograph 01/09/2018.

CLINICAL DATA: 56-year-old female with cirrhosis.  Hyponatremia.

EXAM:
ABDOMEN ULTRASOUND COMPLETE

[Series 1: us abdomen complete · 0.19mm/px · 13 of 85 slices shown]
[im 1/85]
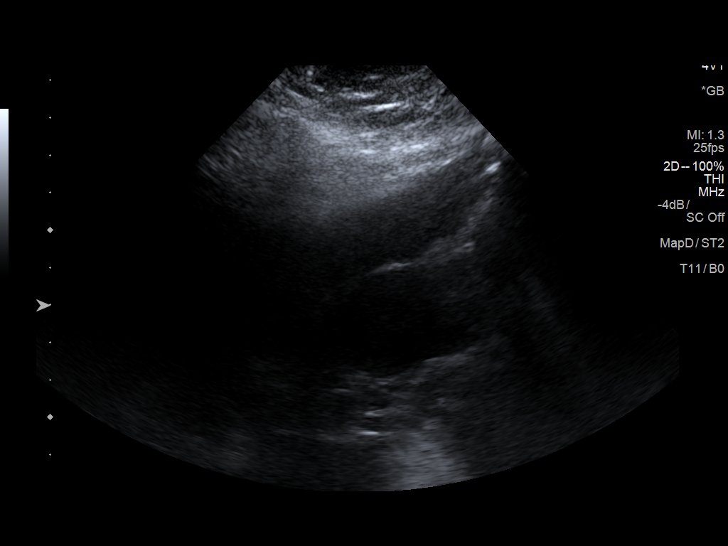
[im 8/85]
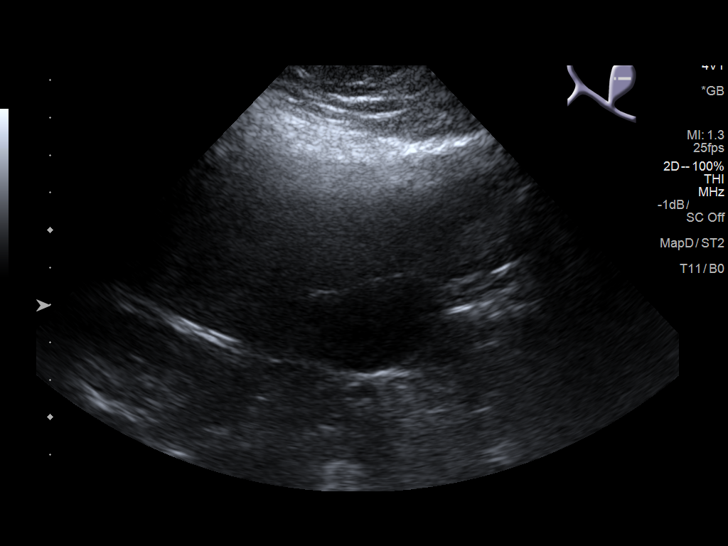
[im 15/85]
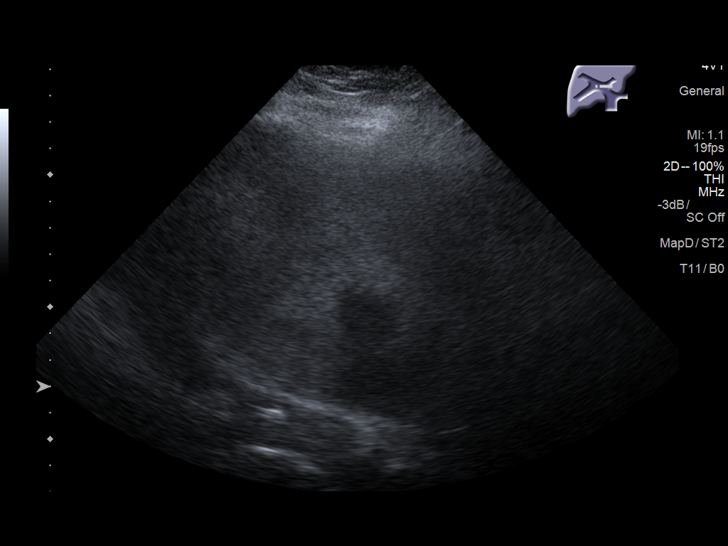
[im 22/85]
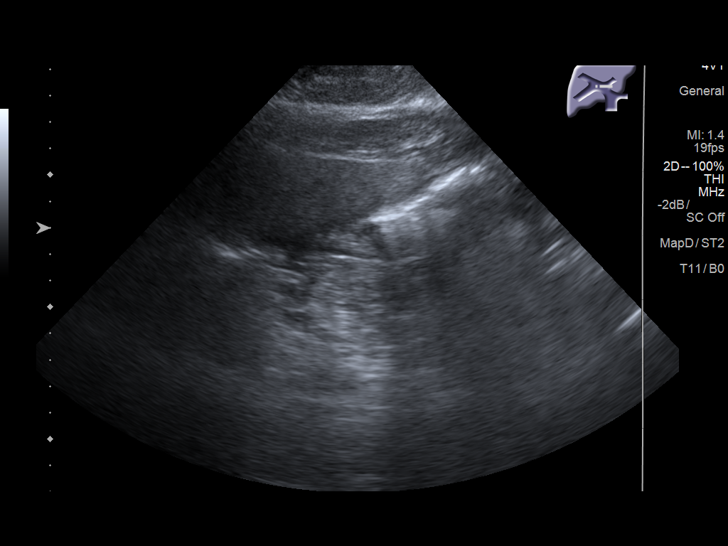
[im 29/85]
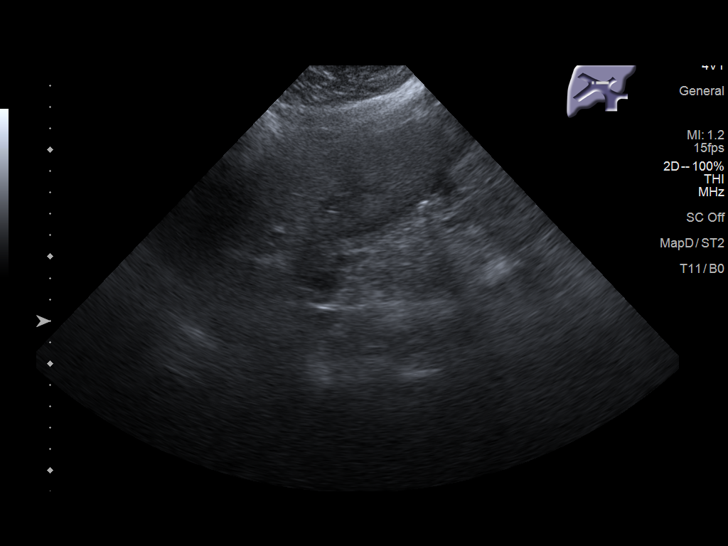
[im 36/85]
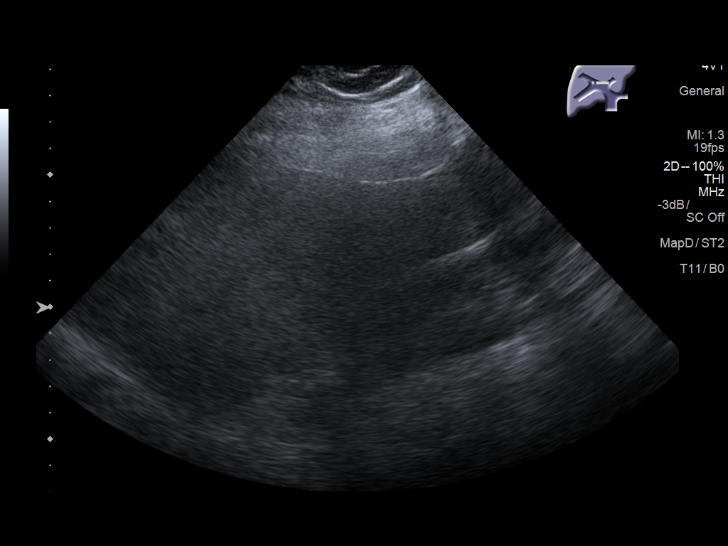
[im 43/85]
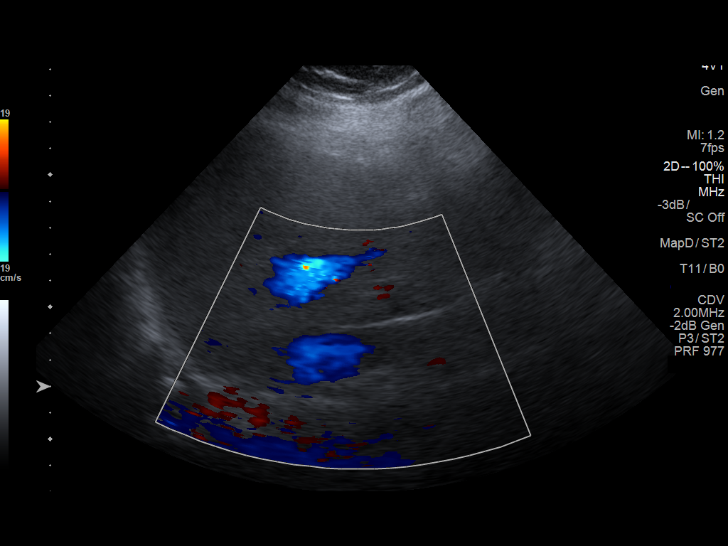
[im 50/85]
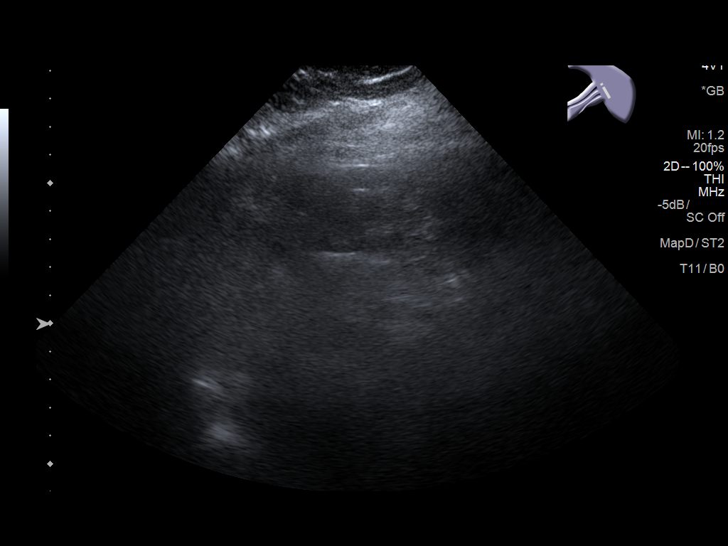
[im 57/85]
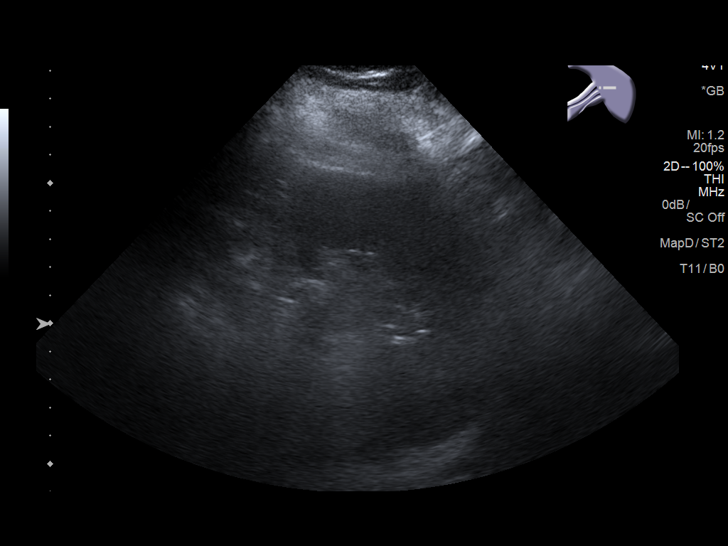
[im 64/85]
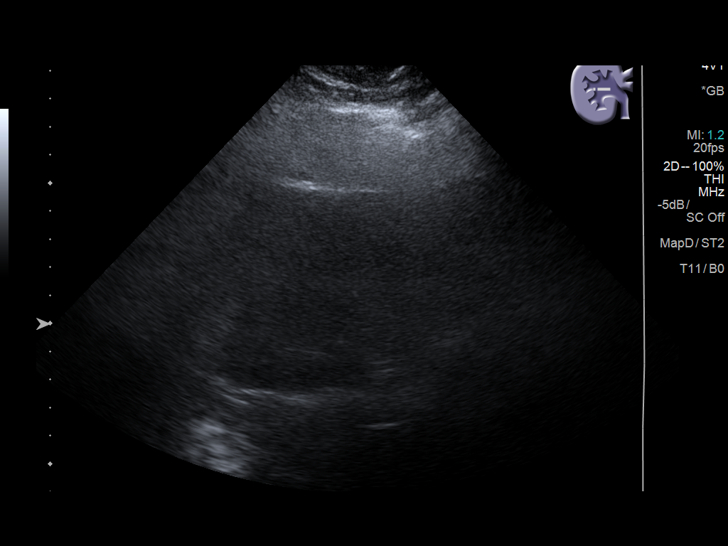
[im 71/85]
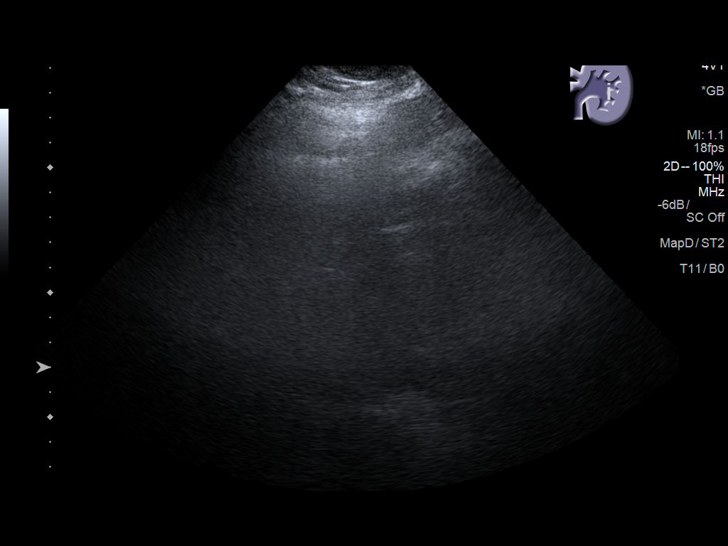
[im 78/85]
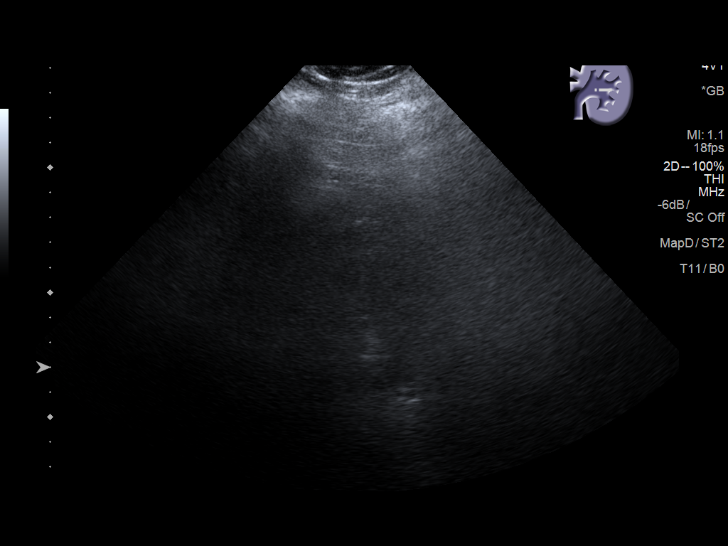
[im 85/85]
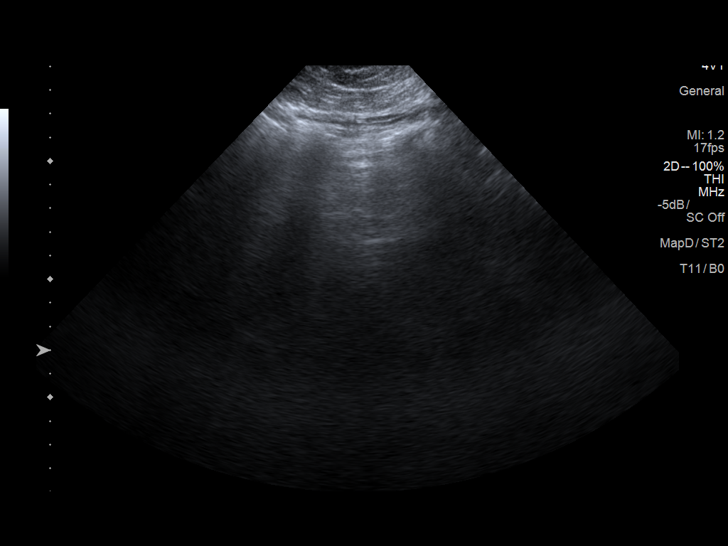

[13 of 25 positions shown; findings below may reference images not displayed]

FINDINGS: Gallbladder: No gallstones or wall thickening visualized. No
sonographic Murphy sign noted by sonographer.

Common bile duct: Diameter: 2 millimeters, normal

Liver: Coarse hepatic echotexture, with a degree of suspected
diffuse increased echogenicity. No discrete liver lesion or biliary
ductal dilatation is identified. Portal vein is patent on color
Doppler imaging with normal direction of blood flow towards the
liver (image 41).

IVC: No abnormality visualized.

Pancreas: The pancreatic head and neck are identified with
questionable partial atrophy (image 51).

Spleen: No splenomegaly, estimated splenic length 6.4 centimeters.

Right Kidney: Length: 12.8 centimeters. Echogenicity within normal
limits. No mass or hydronephrosis visualized.

Left Kidney: Length: Size estimated at 12.5 centimeters, the left
kidney is not as well visualized as the right. No definite left
hydronephrosis.

Abdominal aorta: Incompletely visualized due to overlying bowel gas,
visualized portions within normal limits.

Other findings: Possible right pleural effusion, uncertain.
IMPRESSION: 1. No acute findings identified in the abdomen by ultrasound.
2. Coarse liver echotexture compatible with chronic liver disease.
Possible hepatic steatosis. No discrete liver lesion identified.

## 2018-05-02 DIAGNOSIS — J9612 Chronic respiratory failure with hypercapnia: Secondary | ICD-10-CM | POA: Diagnosis not present

## 2018-05-02 DIAGNOSIS — J449 Chronic obstructive pulmonary disease, unspecified: Secondary | ICD-10-CM | POA: Diagnosis not present

## 2018-06-02 DIAGNOSIS — J449 Chronic obstructive pulmonary disease, unspecified: Secondary | ICD-10-CM | POA: Diagnosis not present

## 2018-06-02 DIAGNOSIS — J9612 Chronic respiratory failure with hypercapnia: Secondary | ICD-10-CM | POA: Diagnosis not present

## 2018-07-02 DIAGNOSIS — J9612 Chronic respiratory failure with hypercapnia: Secondary | ICD-10-CM | POA: Diagnosis not present

## 2018-07-02 DIAGNOSIS — J449 Chronic obstructive pulmonary disease, unspecified: Secondary | ICD-10-CM | POA: Diagnosis not present

## 2018-07-03 ENCOUNTER — Encounter: Payer: Self-pay | Admitting: *Deleted

## 2018-07-03 ENCOUNTER — Telehealth: Payer: Self-pay | Admitting: Internal Medicine

## 2018-07-03 ENCOUNTER — Other Ambulatory Visit: Payer: Self-pay | Admitting: Internal Medicine

## 2018-07-03 DIAGNOSIS — R911 Solitary pulmonary nodule: Secondary | ICD-10-CM

## 2018-07-03 NOTE — Telephone Encounter (Signed)
Fine with me

## 2018-07-03 NOTE — Telephone Encounter (Signed)
Called and spoke with daughter Stephanie Baldwin, made aware that repeat CT are typical when patients have pulmonary nodules, this way we can monitor for any changes. Voiced understanding. Stephanie Baldwin also request that we write a letter for transportation stating that the patient cannot travel alone due to medical necessity and oxygen and that she needs to be allowed to travel with a helper.   MW please advise if okay to write letter.

## 2018-07-03 NOTE — Telephone Encounter (Signed)
Called and spoke with Stephanie Baldwin. She states she needs a letter to be mailed to her so she can travel with patient.   Letter written and mailed to addressed given.

## 2018-07-03 NOTE — Telephone Encounter (Signed)
lmtcb x1 for pt's daughter, Stephanie Baldwin [facility])

## 2018-08-02 DIAGNOSIS — J9612 Chronic respiratory failure with hypercapnia: Secondary | ICD-10-CM | POA: Diagnosis not present

## 2018-08-02 DIAGNOSIS — J449 Chronic obstructive pulmonary disease, unspecified: Secondary | ICD-10-CM | POA: Diagnosis not present

## 2018-08-16 ENCOUNTER — Telehealth: Payer: Self-pay | Admitting: Internal Medicine

## 2018-08-16 ENCOUNTER — Ambulatory Visit (HOSPITAL_BASED_OUTPATIENT_CLINIC_OR_DEPARTMENT_OTHER)
Admission: RE | Admit: 2018-08-16 | Discharge: 2018-08-16 | Disposition: A | Discharge status: Home / Self Care | Payer: Medicaid Other | Source: Ambulatory Visit | Attending: Internal Medicine | Admitting: Internal Medicine

## 2018-08-16 DIAGNOSIS — J439 Emphysema, unspecified: Secondary | ICD-10-CM | POA: Diagnosis not present

## 2018-08-16 DIAGNOSIS — I700 Atherosclerosis of aorta: Secondary | ICD-10-CM | POA: Diagnosis not present

## 2018-08-16 DIAGNOSIS — R911 Solitary pulmonary nodule: Secondary | ICD-10-CM | POA: Diagnosis not present

## 2018-08-16 NOTE — Telephone Encounter (Signed)
Patient's daughter, Karen ChafeJessi called.  She stated that she was  returning call she received from our office from Dr. Sherene SiresWert.  No results available at this time.  Jessi stated understanding. Explained I would route message to Dr. Sherene SiresWert, so he would know that she called. Jessi's number [phone].  Will route to Dr. Sherene SiresWert

## 2018-08-16 NOTE — Telephone Encounter (Signed)
See result notes - no change, will do one last study in 12 months (in reminder file)

## 2018-08-16 NOTE — Telephone Encounter (Signed)
Notes recorded by Nyoka CowdenWert, Michael B, MD on 08/16/2018 at 2:50 PM EST Call patient : Study is unrchanged which is very good news, f/u in 12 m ct to close the loop already placed in reminder file   Called and spoke with pt's daughter Karen ChafeJessi letting her know the results of pt's scan. Jessi expressed understanding. Order has been placed to repeat the scan in 1 year. Nothing further needed.

## 2018-08-19 NOTE — Progress Notes (Signed)
Spoke with pt and notified of results per Dr. Wert. Pt verbalized understanding and denied any questions. 

## 2018-09-01 DIAGNOSIS — J449 Chronic obstructive pulmonary disease, unspecified: Secondary | ICD-10-CM | POA: Diagnosis not present

## 2018-09-01 DIAGNOSIS — J9612 Chronic respiratory failure with hypercapnia: Secondary | ICD-10-CM | POA: Diagnosis not present

## 2018-09-10 DIAGNOSIS — G4733 Obstructive sleep apnea (adult) (pediatric): Secondary | ICD-10-CM | POA: Diagnosis not present

## 2018-09-10 DIAGNOSIS — H9319 Tinnitus, unspecified ear: Secondary | ICD-10-CM | POA: Diagnosis not present

## 2018-09-10 DIAGNOSIS — E871 Hypo-osmolality and hyponatremia: Secondary | ICD-10-CM | POA: Diagnosis not present

## 2018-09-10 DIAGNOSIS — Z9981 Dependence on supplemental oxygen: Secondary | ICD-10-CM | POA: Diagnosis not present

## 2018-09-10 DIAGNOSIS — J9611 Chronic respiratory failure with hypoxia: Secondary | ICD-10-CM | POA: Diagnosis not present

## 2018-09-10 DIAGNOSIS — E1165 Type 2 diabetes mellitus with hyperglycemia: Secondary | ICD-10-CM | POA: Diagnosis not present

## 2018-09-10 DIAGNOSIS — R600 Localized edema: Secondary | ICD-10-CM | POA: Diagnosis not present

## 2018-09-10 DIAGNOSIS — F419 Anxiety disorder, unspecified: Secondary | ICD-10-CM | POA: Diagnosis not present

## 2018-10-02 DIAGNOSIS — J9612 Chronic respiratory failure with hypercapnia: Secondary | ICD-10-CM | POA: Diagnosis not present

## 2018-10-02 DIAGNOSIS — J449 Chronic obstructive pulmonary disease, unspecified: Secondary | ICD-10-CM | POA: Diagnosis not present

## 2018-11-02 DIAGNOSIS — J9612 Chronic respiratory failure with hypercapnia: Secondary | ICD-10-CM | POA: Diagnosis not present

## 2018-11-02 DIAGNOSIS — J449 Chronic obstructive pulmonary disease, unspecified: Secondary | ICD-10-CM | POA: Diagnosis not present

## 2018-12-01 DIAGNOSIS — J449 Chronic obstructive pulmonary disease, unspecified: Secondary | ICD-10-CM | POA: Diagnosis not present

## 2018-12-01 DIAGNOSIS — J9612 Chronic respiratory failure with hypercapnia: Secondary | ICD-10-CM | POA: Diagnosis not present

## 2019-01-01 DIAGNOSIS — J449 Chronic obstructive pulmonary disease, unspecified: Secondary | ICD-10-CM | POA: Diagnosis not present

## 2019-01-01 DIAGNOSIS — J9612 Chronic respiratory failure with hypercapnia: Secondary | ICD-10-CM | POA: Diagnosis not present

## 2019-01-31 DIAGNOSIS — J9612 Chronic respiratory failure with hypercapnia: Secondary | ICD-10-CM | POA: Diagnosis not present

## 2019-01-31 DIAGNOSIS — J449 Chronic obstructive pulmonary disease, unspecified: Secondary | ICD-10-CM | POA: Diagnosis not present

## 2019-03-03 DIAGNOSIS — J449 Chronic obstructive pulmonary disease, unspecified: Secondary | ICD-10-CM | POA: Diagnosis not present

## 2019-03-03 DIAGNOSIS — J9612 Chronic respiratory failure with hypercapnia: Secondary | ICD-10-CM | POA: Diagnosis not present

## 2019-03-21 DIAGNOSIS — G8929 Other chronic pain: Secondary | ICD-10-CM | POA: Diagnosis not present

## 2019-03-21 DIAGNOSIS — G4734 Idiopathic sleep related nonobstructive alveolar hypoventilation: Secondary | ICD-10-CM | POA: Diagnosis not present

## 2019-03-21 DIAGNOSIS — Z7409 Other reduced mobility: Secondary | ICD-10-CM | POA: Diagnosis not present

## 2019-03-21 DIAGNOSIS — M5441 Lumbago with sciatica, right side: Secondary | ICD-10-CM | POA: Diagnosis not present

## 2019-03-21 DIAGNOSIS — F3341 Major depressive disorder, recurrent, in partial remission: Secondary | ICD-10-CM | POA: Diagnosis not present

## 2019-03-21 DIAGNOSIS — F4000 Agoraphobia, unspecified: Secondary | ICD-10-CM | POA: Diagnosis not present

## 2019-03-21 DIAGNOSIS — Z720 Tobacco use: Secondary | ICD-10-CM | POA: Diagnosis not present

## 2019-03-21 DIAGNOSIS — J449 Chronic obstructive pulmonary disease, unspecified: Secondary | ICD-10-CM | POA: Diagnosis not present

## 2019-03-21 DIAGNOSIS — E119 Type 2 diabetes mellitus without complications: Secondary | ICD-10-CM | POA: Diagnosis not present

## 2019-04-02 DIAGNOSIS — J449 Chronic obstructive pulmonary disease, unspecified: Secondary | ICD-10-CM | POA: Diagnosis not present

## 2019-04-02 DIAGNOSIS — J9612 Chronic respiratory failure with hypercapnia: Secondary | ICD-10-CM | POA: Diagnosis not present

## 2019-04-17 DIAGNOSIS — E1165 Type 2 diabetes mellitus with hyperglycemia: Secondary | ICD-10-CM | POA: Diagnosis not present

## 2019-04-17 DIAGNOSIS — G894 Chronic pain syndrome: Secondary | ICD-10-CM | POA: Diagnosis not present

## 2019-04-17 DIAGNOSIS — J9611 Chronic respiratory failure with hypoxia: Secondary | ICD-10-CM | POA: Diagnosis not present

## 2019-04-17 DIAGNOSIS — Z9981 Dependence on supplemental oxygen: Secondary | ICD-10-CM | POA: Diagnosis not present

## 2019-05-03 DIAGNOSIS — J9612 Chronic respiratory failure with hypercapnia: Secondary | ICD-10-CM | POA: Diagnosis not present

## 2019-05-03 DIAGNOSIS — J449 Chronic obstructive pulmonary disease, unspecified: Secondary | ICD-10-CM | POA: Diagnosis not present

## 2019-06-03 DIAGNOSIS — J449 Chronic obstructive pulmonary disease, unspecified: Secondary | ICD-10-CM | POA: Diagnosis not present

## 2019-06-03 DIAGNOSIS — J9612 Chronic respiratory failure with hypercapnia: Secondary | ICD-10-CM | POA: Diagnosis not present

## 2019-07-03 DIAGNOSIS — J449 Chronic obstructive pulmonary disease, unspecified: Secondary | ICD-10-CM | POA: Diagnosis not present

## 2019-07-03 DIAGNOSIS — J9612 Chronic respiratory failure with hypercapnia: Secondary | ICD-10-CM | POA: Diagnosis not present

## 2019-07-25 ENCOUNTER — Other Ambulatory Visit: Payer: Self-pay

## 2019-07-25 DIAGNOSIS — R911 Solitary pulmonary nodule: Secondary | ICD-10-CM

## 2019-07-28 ENCOUNTER — Telehealth: Payer: Self-pay | Admitting: Internal Medicine

## 2019-07-28 NOTE — Telephone Encounter (Signed)
I called daughter to get this resc they wanted to cancel till after she sees Dr Clovis Riley then they will call back

## 2019-08-03 DIAGNOSIS — J9612 Chronic respiratory failure with hypercapnia: Secondary | ICD-10-CM | POA: Diagnosis not present

## 2019-08-03 DIAGNOSIS — J449 Chronic obstructive pulmonary disease, unspecified: Secondary | ICD-10-CM | POA: Diagnosis not present

## 2019-08-18 ENCOUNTER — Other Ambulatory Visit: Payer: Medicaid Other

## 2019-08-26 ENCOUNTER — Ambulatory Visit: Payer: Medicaid Other | Admitting: Family Medicine

## 2019-09-02 DIAGNOSIS — J449 Chronic obstructive pulmonary disease, unspecified: Secondary | ICD-10-CM | POA: Diagnosis not present

## 2019-09-02 DIAGNOSIS — J9612 Chronic respiratory failure with hypercapnia: Secondary | ICD-10-CM | POA: Diagnosis not present

## 2019-10-03 DIAGNOSIS — J449 Chronic obstructive pulmonary disease, unspecified: Secondary | ICD-10-CM | POA: Diagnosis not present

## 2019-10-03 DIAGNOSIS — J9612 Chronic respiratory failure with hypercapnia: Secondary | ICD-10-CM | POA: Diagnosis not present

## 2019-11-03 DIAGNOSIS — J9612 Chronic respiratory failure with hypercapnia: Secondary | ICD-10-CM | POA: Diagnosis not present

## 2019-11-03 DIAGNOSIS — J449 Chronic obstructive pulmonary disease, unspecified: Secondary | ICD-10-CM | POA: Diagnosis not present

## 2019-11-12 IMAGING — DX DG CHEST 1V PORT
1 series · 1 of 1 positions shown · non-contrast
Comparison: None.

CLINICAL DATA: Initial evaluation for endotracheal tube placement.

EXAM:
PORTABLE CHEST 1 VIEW

[chest ap]
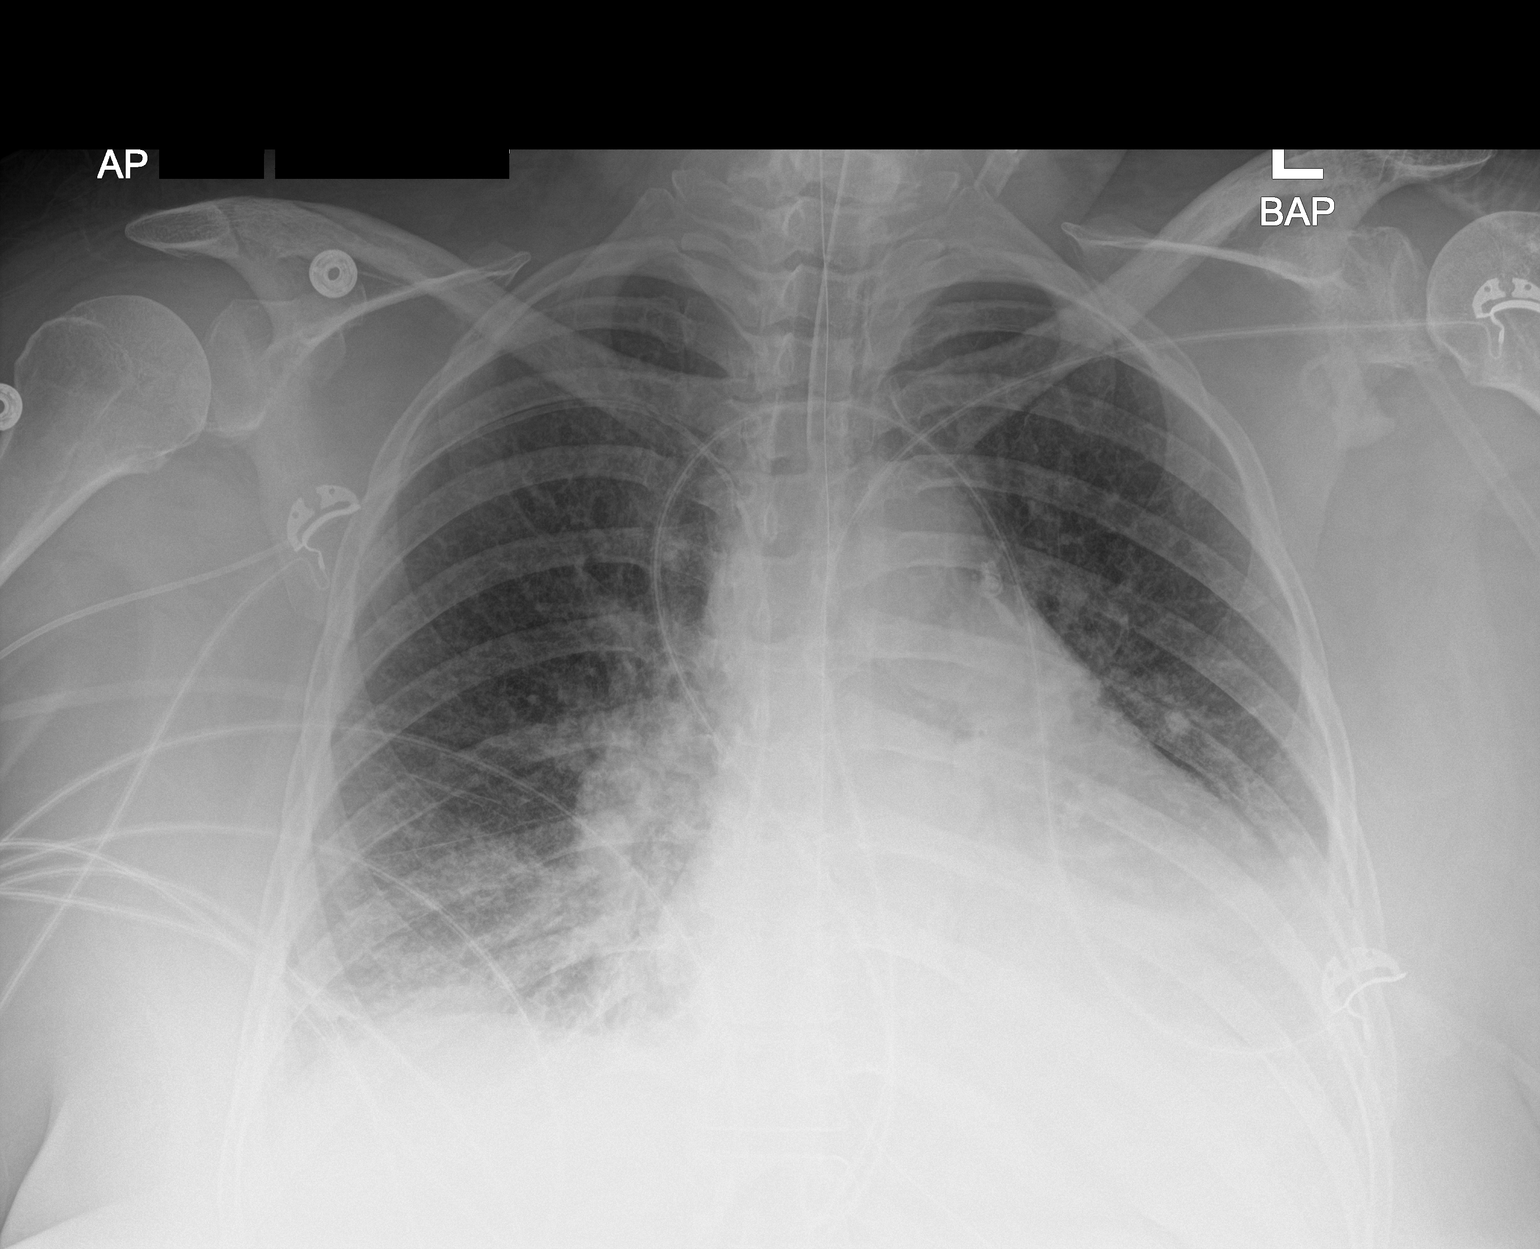

[1 of 1 positions shown; findings below may reference images not displayed]

FINDINGS: Patient is intubated with the tip of an endotracheal tube well
positioned 5 cm above the carina. Enteric tube courses into the
abdomen. Right PICC catheter in place with tip overlying the
cavoatrial junction.

Cardiomegaly.  Mediastinal silhouette normal.

Lungs hypoinflated. Diffuse vascular congestion with interstitial
prominence, suggesting pulmonary interstitial edema. Small bilateral
pleural effusions, left greater than right. Associated bibasilar
opacities likely reflect atelectasis and/or edema, although
infiltrates could be considered in the correct clinical setting. No
pneumothorax.

No acute osseous abnormality.
IMPRESSION: 1. Tip of endotracheal tube well positioned 5 cm above the carina.
2. Cardiomegaly with moderate diffuse pulmonary interstitial edema
and left greater than right small bilateral pleural effusions.
3. Associated bibasilar opacities favored to reflect atelectasis
and/or edema. Infiltrates could be considered in the correct
clinical setting.

## 2019-11-14 IMAGING — DX DG CHEST 1V PORT
1 series · 1 of 1 positions shown · non-contrast
Comparison: 01/09/2018

CLINICAL DATA: Follow-up pulmonary edema

EXAM:
PORTABLE CHEST 1 VIEW

[chest ap]
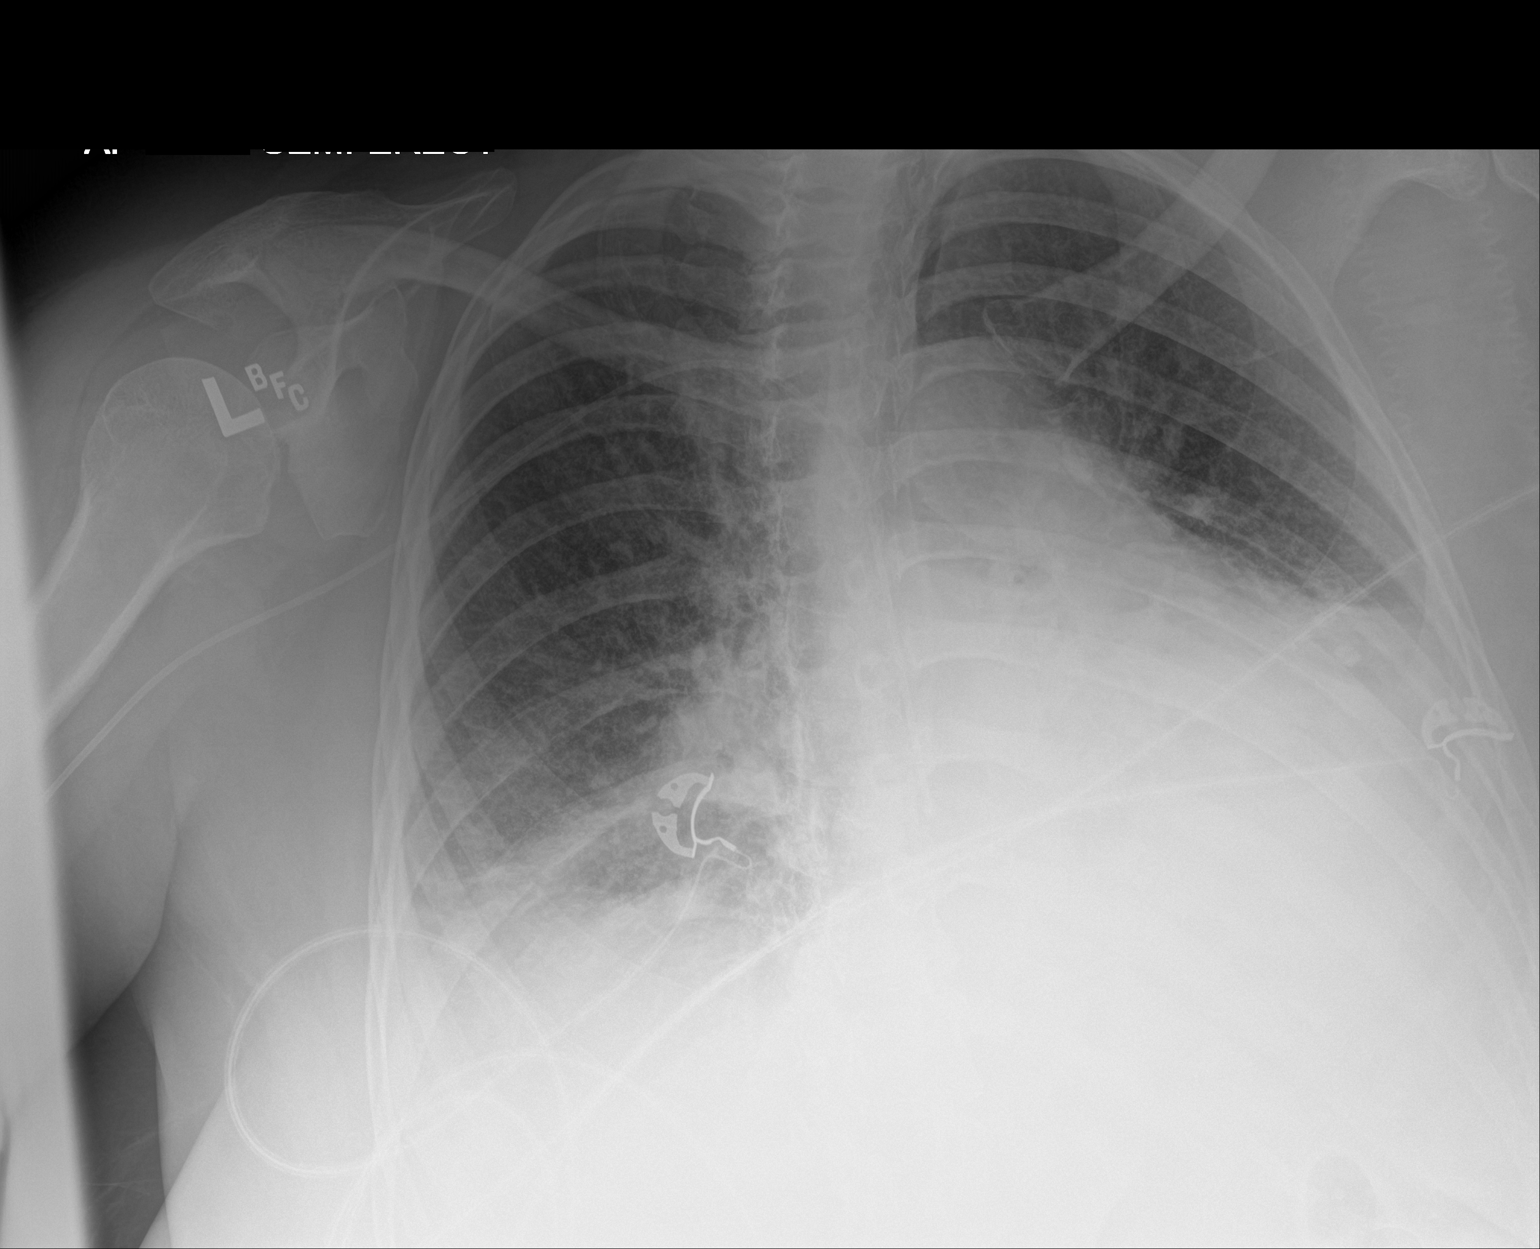

[1 of 1 positions shown; findings below may reference images not displayed]

FINDINGS: Endotracheal tube and nasogastric catheter have been removed in the
interval. A right-sided PICC line is again noted in satisfactory
position. Cardiac shadow remains mildly enlarged with central
vascular congestion although the degree of interstitial edema has
improved from the prior study. Bibasilar atelectatic changes are
noted. No sizable effusion is seen.
IMPRESSION: Improved vascular congestion and interstitial edema. Bibasilar
atelectatic changes are noted.

## 2019-11-15 IMAGING — DX DG CHEST 1V PORT
1 series · 1 of 1 positions shown · non-contrast
Comparison: Chest x-ray from yesterday.

CLINICAL DATA: Pulmonary edema.

EXAM:
PORTABLE CHEST 1 VIEW

[chest]
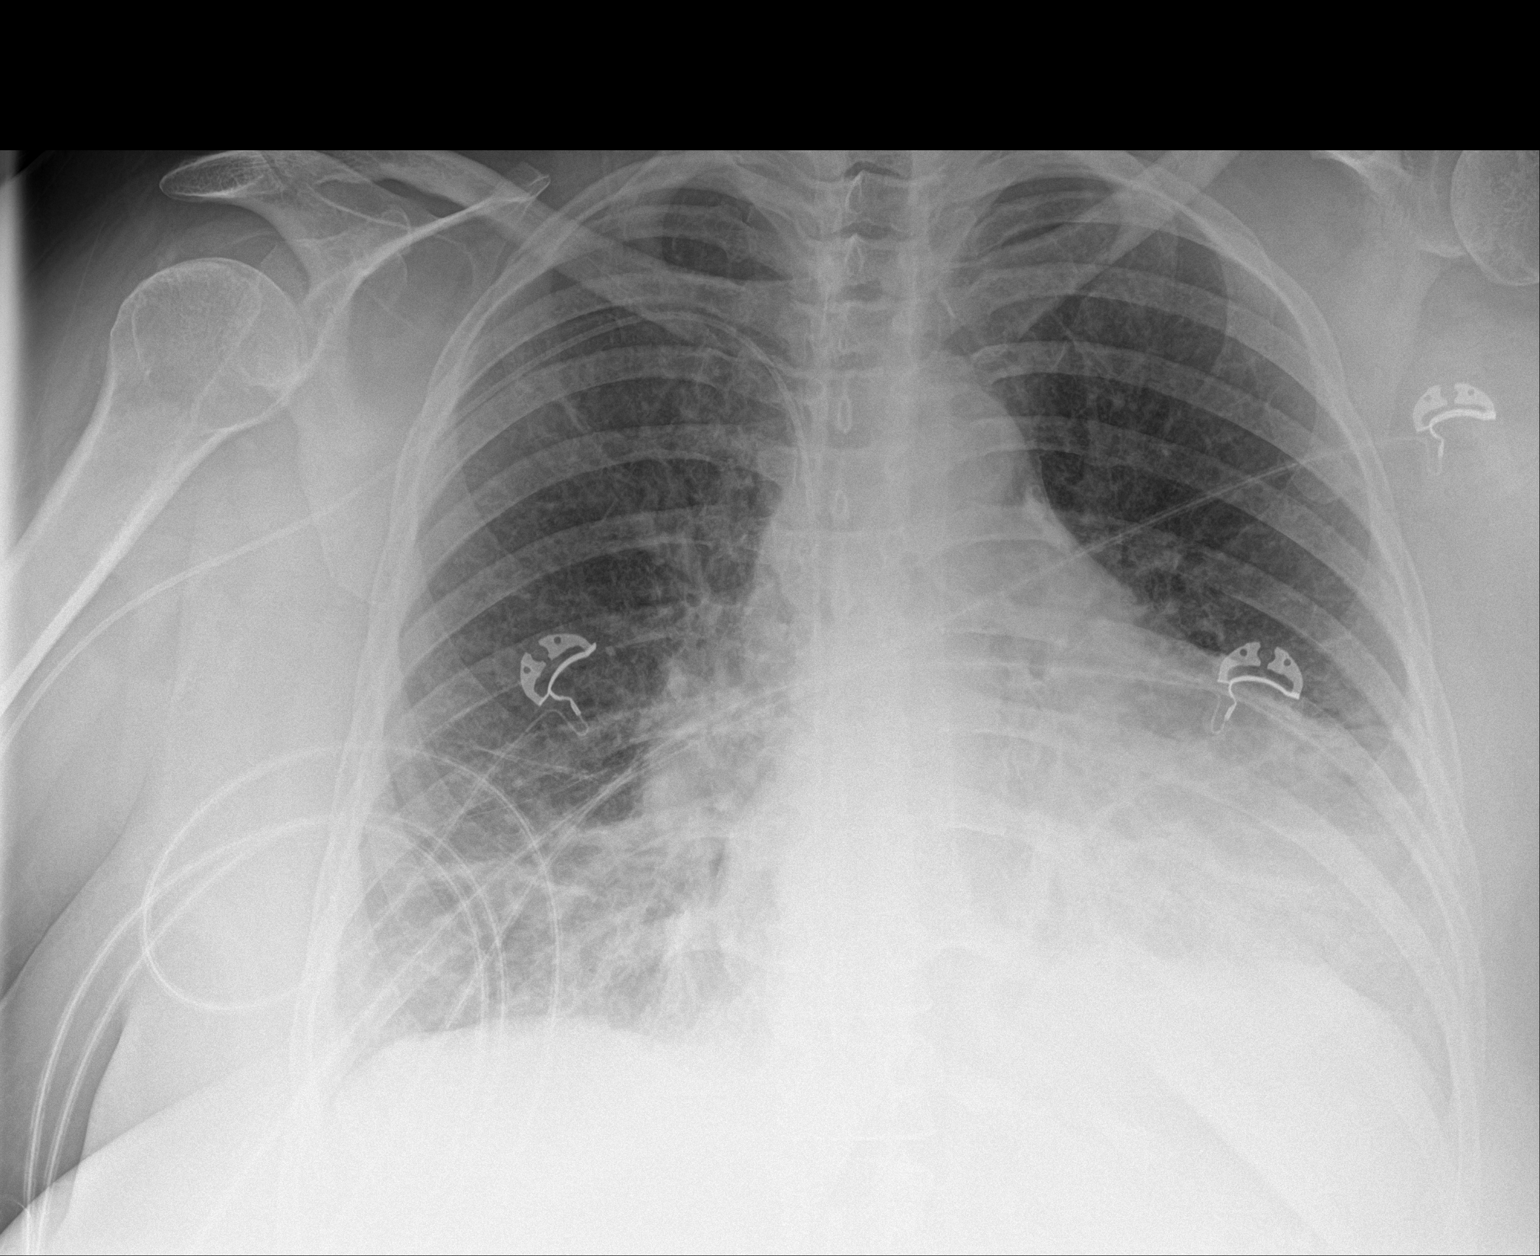

[1 of 1 positions shown; findings below may reference images not displayed]

FINDINGS: Unchanged right PICC line with the tip in the proximal right atrium.
Stable cardiomegaly. Improved interstitial edema and bibasilar
atelectasis. No large pleural effusion. No pneumothorax. No acute
osseous abnormality.
IMPRESSION: 1. Improved interstitial edema and bibasilar atelectasis.

## 2019-12-01 DIAGNOSIS — E559 Vitamin D deficiency, unspecified: Secondary | ICD-10-CM

## 2019-12-01 DIAGNOSIS — J9612 Chronic respiratory failure with hypercapnia: Secondary | ICD-10-CM | POA: Diagnosis not present

## 2019-12-01 DIAGNOSIS — J449 Chronic obstructive pulmonary disease, unspecified: Secondary | ICD-10-CM | POA: Diagnosis not present

## 2019-12-01 HISTORY — DX: Vitamin D deficiency, unspecified: E55.9

## 2019-12-11 ENCOUNTER — Telehealth: Payer: Self-pay | Admitting: General Practice

## 2019-12-11 NOTE — Telephone Encounter (Signed)
Pt called and reminded of appointment 

## 2019-12-12 ENCOUNTER — Ambulatory Visit (HOSPITAL_COMMUNITY)
Admission: RE | Admit: 2019-12-12 | Discharge: 2019-12-12 | Disposition: A | Discharge status: Home / Self Care | Payer: Medicaid Other | Source: Ambulatory Visit | Attending: Family Medicine | Admitting: Family Medicine

## 2019-12-12 ENCOUNTER — Other Ambulatory Visit: Payer: Self-pay

## 2019-12-12 ENCOUNTER — Ambulatory Visit (INDEPENDENT_AMBULATORY_CARE_PROVIDER_SITE_OTHER): Payer: Medicaid Other | Admitting: Family Medicine

## 2019-12-12 ENCOUNTER — Encounter: Payer: Self-pay | Admitting: Family Medicine

## 2019-12-12 VITALS — BP 137/84 | HR 110 | Temp 98.5°F | Ht 62.0 in | Wt 252.6 lb

## 2019-12-12 DIAGNOSIS — G8929 Other chronic pain: Secondary | ICD-10-CM | POA: Diagnosis not present

## 2019-12-12 DIAGNOSIS — Z9981 Dependence on supplemental oxygen: Secondary | ICD-10-CM

## 2019-12-12 DIAGNOSIS — M549 Dorsalgia, unspecified: Secondary | ICD-10-CM

## 2019-12-12 DIAGNOSIS — R0600 Dyspnea, unspecified: Secondary | ICD-10-CM | POA: Diagnosis not present

## 2019-12-12 DIAGNOSIS — Z7689 Persons encountering health services in other specified circumstances: Secondary | ICD-10-CM | POA: Diagnosis not present

## 2019-12-12 DIAGNOSIS — R911 Solitary pulmonary nodule: Secondary | ICD-10-CM | POA: Insufficient documentation

## 2019-12-12 DIAGNOSIS — M25552 Pain in left hip: Secondary | ICD-10-CM

## 2019-12-12 DIAGNOSIS — T7840XA Allergy, unspecified, initial encounter: Secondary | ICD-10-CM

## 2019-12-12 DIAGNOSIS — Z09 Encounter for follow-up examination after completed treatment for conditions other than malignant neoplasm: Secondary | ICD-10-CM | POA: Diagnosis not present

## 2019-12-12 DIAGNOSIS — R0609 Other forms of dyspnea: Secondary | ICD-10-CM

## 2019-12-12 DIAGNOSIS — R600 Localized edema: Secondary | ICD-10-CM

## 2019-12-12 DIAGNOSIS — Z0000 Encounter for general adult medical examination without abnormal findings: Secondary | ICD-10-CM

## 2019-12-12 LAB — POCT URINALYSIS DIPSTICK
Bilirubin, UA: NEGATIVE
Blood, UA: NEGATIVE
Glucose, UA: NEGATIVE
Ketones, UA: NEGATIVE
Leukocytes, UA: NEGATIVE
Nitrite, UA: NEGATIVE
Protein, UA: POSITIVE — AB
Spec Grav, UA: 1.015 (ref 1.010–1.025)
Urobilinogen, UA: 0.2 E.U./dL
pH, UA: 7 (ref 5.0–8.0)

## 2019-12-12 LAB — POCT GLYCOSYLATED HEMOGLOBIN (HGB A1C): Hemoglobin A1C: 7.4 % — AB (ref 4.0–5.6)

## 2019-12-12 LAB — GLUCOSE, POCT (MANUAL RESULT ENTRY): POC Glucose: 207 mg/dl — AB (ref 70–99)

## 2019-12-12 MED ORDER — FUROSEMIDE 20 MG PO TABS: 20.0000 mg | ORAL_TABLET | Freq: Two times a day (BID) | ORAL | 3 refills | Status: AC

## 2019-12-12 MED ORDER — NAPROXEN 500 MG PO TABS: 500.0000 mg | ORAL_TABLET | Freq: Two times a day (BID) | ORAL | 3 refills | Status: AC

## 2019-12-12 MED ORDER — DIPHENHYDRAMINE HCL 25 MG PO TABS: 25.0000 mg | ORAL_TABLET | Freq: Four times a day (QID) | ORAL | 2 refills | Status: AC | PRN

## 2019-12-12 NOTE — Progress Notes (Signed)
[facility]   Established Patient Office Visit  Subjective:  Patient ID: Stephanie Baldwin, female    DOB: 1961-05-05  Age: 59 y.o. MRN: [id number]  CC:  Chief Complaint  Patient presents with  . New Patient (Initial Visit)    Swelling in hand and feet, left hip pain, lower back pain    HPI Stephanie Baldwin is a 59 year old female who presents for Follow Up today.   Past Medical History:  Diagnosis Date  . Adrenal insufficiency (HCC) 01/16/2018  . Agoraphobia   . Depression   . Obesity, Class III, BMI 40-49.9 (morbid obesity) (HCC) 01/16/2018  . OSA (obstructive sleep apnea) 01/16/2018  . Tobacco abuse    Current Status: This will be Stephanie Baldwin's initial office visit with me. She was previously seeing Stephanie Elk, MD, [facility] in [place], [place] for her PCP needs. Since her last office visit, she is doing well with no complaints. She is currently seeing Stephanie Baldwin, Pulmonology. She is currently on 3 liters of oxygen as needed and at bedtime. No chest pain, heart palpitations, cough and shortness of breath reported.She has chronic back, and left hip pain. She currently smokes < a pack of cigarettes daily. She had c/o abdominal swelling and bloating for a few years now. She denies fevers, chills, fatigue, recent infections, weight loss, and night sweats. She has not had any headaches, visual changes, dizziness, and falls.  No reports of GI problems such as nausea, vomiting, diarrhea, and constipation. She has no reports of blood in stools, dysuria and hematuria. No depression or anxiety, and denies suicidal ideations, homicidal ideations, or auditory hallucinations. She denies pain today. She is accompanied by her daughter today.    Past Surgical History:  Procedure Laterality Date  . CESAREAN SECTION  1986  . CESAREAN SECTION  1989    Family History  Problem Relation Age of Onset  . Cancer Mother   . Diabetes Mother   .  Heart disease Mother   . Hypothyroidism Mother   . Stroke Mother   . Cancer Father     Social History   Socioeconomic History  . Marital status: Divorced    Spouse name: Not on file  . Number of children: Not on file  . Years of education: Not on file  . Highest education level: Not on file  Occupational History  . Not on file  Tobacco Use  . Smoking status: Current Every Day Smoker    Packs/day: 1.00    Years: 43.00    Pack years: 43.00    Types: Cigarettes    Last attempt to quit: 01/09/2018    Years since quitting: 1.9  . Smokeless tobacco: Never Used  Substance and Sexual Activity  . Alcohol use: Not Currently  . Drug use: Not Currently  . Sexual activity: Not Currently  Other Topics Concern  . Not on file  Social History Narrative  . Not on file   Social Determinants of Health   Financial Resource Strain:   . Difficulty of Paying Living Expenses:   Food Insecurity:   . Worried About Programme researcher, broadcasting/film/video in the Last Year:   . Barista in the Last Year:   Transportation Needs:   . Freight forwarder (Medical):   Marland Kitchen Lack of Transportation (Non-Medical):   Physical Activity:   . Days of Exercise per Week:   . Minutes of Exercise per Session:  Stress:   . Feeling of Stress :   Social Connections:   . Frequency of Communication with Friends and Family:   . Frequency of Social Gatherings with Friends and Family:   . Attends Religious Services:   . Active Member of Clubs or Organizations:   . Attends Banker Meetings:   Marland Kitchen Marital Status:   Intimate Partner Violence:   . Fear of Current or Ex-Partner:   . Emotionally Abused:   Marland Kitchen Physically Abused:   . Sexually Abused:     Outpatient Medications Prior to Visit  Medication Sig Dispense Refill  . acetaminophen (TYLENOL) 325 MG tablet Take 650 mg by mouth every 6 (six) hours as needed.    . traMADol (ULTRAM) 50 MG tablet Take by mouth.    Marland Kitchen albuterol (PROVENTIL HFA;VENTOLIN HFA) 108 (90  Base) MCG/ACT inhaler Inhale 2 puffs into the lungs every 6 (six) hours as needed for wheezing or shortness of breath. Please instruct in proper MDI technique. (Patient not taking: Reported on 12/12/2019) 1 Inhaler 0  . spironolactone (ALDACTONE) 25 MG tablet Take 1 tablet (25 mg total) by mouth 2 (two) times daily. (Patient not taking: Reported on 12/12/2019) 60 tablet 2  . furosemide (LASIX) 20 MG tablet Take 1 tablet (20 mg total) by mouth 2 (two) times daily. (Patient not taking: Reported on 12/12/2019) 60 tablet 2  . nicotine (NICODERM CQ - DOSED IN MG/24 HOURS) 14 mg/24hr patch Place 1 patch (14 mg total) onto the skin daily. 28 patch 0   No facility-administered medications prior to visit.    Allergies  Allergen Reactions  . Guaifenesin & Derivatives   . Paxil [Paroxetine]   . Penicillins   . Sulfa Antibiotics     ROS Review of Systems  Constitutional: Negative.   HENT: Negative.   Eyes: Negative.   Respiratory: Negative.   Cardiovascular: Negative.   Gastrointestinal: Positive for abdominal distention (obese).  Endocrine: Negative.   Genitourinary: Negative.   Musculoskeletal: Positive for arthralgias (generalize).  Skin: Negative.   Allergic/Immunologic: Negative.   Neurological: Negative.   Hematological: Negative.   Psychiatric/Behavioral: Negative.       Objective:    Physical Exam  Constitutional: She appears well-developed and well-nourished.  HENT:  Head: Normocephalic and atraumatic.  Eyes: Conjunctivae are normal.  Cardiovascular: Normal rate, regular rhythm, normal heart sounds and intact distal pulses.  Pulmonary/Chest: Effort normal and breath sounds normal.  Abdominal: Soft. Bowel sounds are normal.  Musculoskeletal:        General: Edema (lower extremity) present. Normal range of motion.     Cervical back: Normal range of motion and neck supple.  Skin: Skin is warm and dry.  Nursing note and vitals reviewed.   BP 137/84   Pulse (!) 110   Temp  98.5 F (36.9 C)   Ht 5\' 2"  (1.575 m)   Wt 252 lb 9.6 oz (114.6 kg)   SpO2 93% Comment: 2LT of O2  BMI 46.20 kg/m  Wt Readings from Last 3 Encounters:  12/12/19 252 lb 9.6 oz (114.6 kg)  03/08/18 237 lb 9.6 oz (107.8 kg)  02/27/18 238 lb 8 oz (108.2 kg)     Health Maintenance Due  Topic Date Due  . URINE MICROALBUMIN  Never done  . TETANUS/TDAP  Never done  . PAP SMEAR-Modifier  Never done  . MAMMOGRAM  Never done  . COLONOSCOPY  Never done  . INFLUENZA VACCINE  Never done    There are no preventive  care reminders to display for this patient.  Lab Results  Component Value Date   TSH 1.950 12/12/2019   Lab Results  Component Value Date   WBC 9.7 12/12/2019   HGB 18.6 (H) 12/12/2019   HCT 56.8 (H) 12/12/2019   MCV 93 12/12/2019   PLT 193 12/12/2019   Lab Results  Component Value Date   NA 141 12/12/2019   K 4.4 12/12/2019   CO2 25 12/12/2019   GLUCOSE 141 (H) 12/12/2019   BUN 5 (L) 12/12/2019   CREATININE 0.56 (L) 12/12/2019   BILITOT 0.4 12/12/2019   ALKPHOS 131 (H) 12/12/2019   AST 28 12/12/2019   ALT 17 12/12/2019   PROT 7.5 12/12/2019   ALBUMIN 4.2 12/12/2019   CALCIUM 9.3 12/12/2019   ANIONGAP 8 01/16/2018   GFR 105.66 03/08/2018   Lab Results  Component Value Date   CHOL 177 12/12/2019   Lab Results  Component Value Date   HDL 39 (L) 12/12/2019   Lab Results  Component Value Date   LDLCALC 108 (H) 12/12/2019   Lab Results  Component Value Date   TRIG 169 (H) 12/12/2019   Lab Results  Component Value Date   CHOLHDL 4.5 (H) 12/12/2019   Lab Results  Component Value Date   HGBA1C 7.4 (A) 12/12/2019    Assessment & Plan:   1. Encounter to establish care  2. Nodule of right lung History of lung nodule. We will re-assess today. - DG Chest 2 View  3. DOE (dyspnea on exertion) Moderate.   4. Oxygen dependent 2 units of oxygen use. Stable today no signs or symptoms of distress noted today.   5. Chronic back pain, unspecified  back location, unspecified back pain laterality We will initiate Naproxen today.  - naproxen (NAPROSYN) 500 MG tablet; Take 1 tablet (500 mg total) by mouth 2 (two) times daily with a meal.  Dispense: 60 tablet; Refill: 3  5. Chronic left hip pain - naproxen (NAPROSYN) 500 MG tablet; Take 1 tablet (500 mg total) by mouth 2 (two) times daily with a meal.  Dispense: 60 tablet; Refill: 3  6. Allergy, initial encounter Diphenhydramine is effective. She will continue as needed.  - diphenhydrAMINE (BENADRYL) 25 MG tablet; Take 1 tablet (25 mg total) by mouth every 6 (six) hours as needed.  Dispense: 30 tablet; Refill: 2  7. Bilateral lower extremity edema - furosemide (LASIX) 20 MG tablet; Take 1 tablet (20 mg total) by mouth 2 (two) times daily.  Dispense: 60 tablet; Refill: 3  8. Health care maintenance - POCT glycosylated hemoglobin (Hb A1C) - POCT urinalysis dipstick - POCT glucose (manual entry) - CBC with Differential - Comprehensive metabolic panel - Lipid Panel - TSH - Vitamin B12 - Vitamin D, 25-hydroxy  9. Follow up She will follow up in 3 months.   Meds ordered this encounter  Medications  . diphenhydrAMINE (BENADRYL) 25 MG tablet    Sig: Take 1 tablet (25 mg total) by mouth every 6 (six) hours as needed.    Dispense:  30 tablet    Refill:  2  . naproxen (NAPROSYN) 500 MG tablet    Sig: Take 1 tablet (500 mg total) by mouth 2 (two) times daily with a meal.    Dispense:  60 tablet    Refill:  3  . furosemide (LASIX) 20 MG tablet    Sig: Take 1 tablet (20 mg total) by mouth 2 (two) times daily.    Dispense:  60 tablet  Refill:  3    Orders Placed This Encounter  Procedures  . DG Chest 2 View  . CBC with Differential  . Comprehensive metabolic panel  . Lipid Panel  . TSH  . Vitamin B12  . Vitamin D, 25-hydroxy  . POCT glycosylated hemoglobin (Hb A1C)  . POCT urinalysis dipstick  . POCT glucose (manual entry)    Raliegh Ip,  MSN, FNP-BC [facility]  [facility] [address] [place], [place] [address] [phone] fax   Problem List Items Addressed This Visit      Other   DOE (dyspnea on exertion)    Other Visit Diagnoses    Encounter to establish care    -  Primary   Nodule of right lung       Relevant Orders   DG Chest 2 View (Completed)   Chronic back pain, unspecified back location, unspecified back pain laterality       Relevant Medications   naproxen (NAPROSYN) 500 MG tablet   Chronic left hip pain       Relevant Medications   naproxen (NAPROSYN) 500 MG tablet   Allergy, initial encounter       Relevant Medications   diphenhydrAMINE (BENADRYL) 25 MG tablet   Bilateral lower extremity edema       Relevant Medications   furosemide (LASIX) 20 MG tablet   Health care maintenance       Relevant Orders   POCT glycosylated hemoglobin (Hb A1C) (Completed)   POCT urinalysis dipstick (Completed)   POCT glucose (manual entry) (Completed)   CBC with Differential (Completed)   Comprehensive metabolic panel (Completed)   Lipid Panel (Completed)   TSH (Completed)   Vitamin B12 (Completed)   Vitamin D, 25-hydroxy (Completed)   Follow up          Meds ordered this encounter  Medications  . diphenhydrAMINE (BENADRYL) 25 MG tablet    Sig: Take 1 tablet (25 mg total) by mouth every 6 (six) hours as needed.    Dispense:  30 tablet    Refill:  2  . naproxen (NAPROSYN) 500 MG tablet    Sig: Take 1 tablet (500 mg total) by mouth 2 (two) times daily with a meal.    Dispense:  60 tablet    Refill:  3  . furosemide (LASIX) 20 MG tablet    Sig: Take 1 tablet (20 mg total) by mouth 2 (two) times daily.    Dispense:  60 tablet    Refill:  3    Follow-up: Return in about 3 months (around 03/13/2020).    Kallie Locks, FNP

## 2019-12-13 LAB — CBC WITH DIFFERENTIAL/PLATELET
Basophils Absolute: 0.1 10*3/uL (ref 0.0–0.2)
Basos: 1 %
EOS (ABSOLUTE): 0.1 10*3/uL (ref 0.0–0.4)
Eos: 1 %
Hematocrit: 56.8 % — ABNORMAL HIGH (ref 34.0–46.6)
Hemoglobin: 18.6 g/dL — ABNORMAL HIGH (ref 11.1–15.9)
Immature Grans (Abs): 0.1 10*3/uL (ref 0.0–0.1)
Immature Granulocytes: 1 %
Lymphocytes Absolute: 2.5 10*3/uL (ref 0.7–3.1)
Lymphs: 26 %
MCH: 30.6 pg (ref 26.6–33.0)
MCHC: 32.7 g/dL (ref 31.5–35.7)
MCV: 93 fL (ref 79–97)
Monocytes Absolute: 0.6 10*3/uL (ref 0.1–0.9)
Monocytes: 6 %
Neutrophils Absolute: 6.3 10*3/uL (ref 1.4–7.0)
Neutrophils: 65 %
Platelets: 193 10*3/uL (ref 150–450)
RBC: 6.08 x10E6/uL — ABNORMAL HIGH (ref 3.77–5.28)
RDW: 13 % (ref 11.7–15.4)
WBC: 9.7 10*3/uL (ref 3.4–10.8)

## 2019-12-13 LAB — COMPREHENSIVE METABOLIC PANEL
ALT: 17 IU/L (ref 0–32)
AST: 28 IU/L (ref 0–40)
Albumin/Globulin Ratio: 1.3 (ref 1.2–2.2)
Albumin: 4.2 g/dL (ref 3.8–4.9)
Alkaline Phosphatase: 131 IU/L — ABNORMAL HIGH (ref 39–117)
BUN/Creatinine Ratio: 9 (ref 9–23)
BUN: 5 mg/dL — ABNORMAL LOW (ref 6–24)
Bilirubin Total: 0.4 mg/dL (ref 0.0–1.2)
CO2: 25 mmol/L (ref 20–29)
Calcium: 9.3 mg/dL (ref 8.7–10.2)
Chloride: 95 mmol/L — ABNORMAL LOW (ref 96–106)
Creatinine, Ser: 0.56 mg/dL — ABNORMAL LOW (ref 0.57–1.00)
GFR calc Af Amer: 119 mL/min/{1.73_m2} (ref >59)
GFR calc non Af Amer: 103 mL/min/{1.73_m2} (ref >59)
Globulin, Total: 3.3 g/dL (ref 1.5–4.5)
Glucose: 141 mg/dL — ABNORMAL HIGH (ref 65–99)
Potassium: 4.4 mmol/L (ref 3.5–5.2)
Sodium: 141 mmol/L (ref 134–144)
Total Protein: 7.5 g/dL (ref 6.0–8.5)

## 2019-12-13 LAB — LIPID PANEL
Chol/HDL Ratio: 4.5 ratio — ABNORMAL HIGH (ref 0.0–4.4)
Cholesterol, Total: 177 mg/dL (ref 100–199)
HDL: 39 mg/dL — ABNORMAL LOW (ref >39)
LDL Chol Calc (NIH): 108 mg/dL — ABNORMAL HIGH (ref 0–99)
Triglycerides: 169 mg/dL — ABNORMAL HIGH (ref 0–149)
VLDL Cholesterol Cal: 30 mg/dL (ref 5–40)

## 2019-12-13 LAB — VITAMIN B12: Vitamin B-12: 477 pg/mL (ref 232–1245)

## 2019-12-13 LAB — TSH: TSH: 1.95 u[IU]/mL (ref 0.450–4.500)

## 2019-12-13 LAB — VITAMIN D 25 HYDROXY (VIT D DEFICIENCY, FRACTURES): Vit D, 25-Hydroxy: 6.2 ng/mL — ABNORMAL LOW (ref 30.0–100.0)

## 2019-12-22 ENCOUNTER — Telehealth: Payer: Self-pay

## 2019-12-22 ENCOUNTER — Encounter: Payer: Self-pay | Admitting: Family Medicine

## 2019-12-22 ENCOUNTER — Other Ambulatory Visit: Payer: Self-pay | Admitting: Family Medicine

## 2019-12-22 DIAGNOSIS — T7840XA Allergy, unspecified, initial encounter: Secondary | ICD-10-CM

## 2019-12-22 DIAGNOSIS — E559 Vitamin D deficiency, unspecified: Secondary | ICD-10-CM

## 2019-12-22 DIAGNOSIS — R0600 Dyspnea, unspecified: Secondary | ICD-10-CM

## 2019-12-22 DIAGNOSIS — R0609 Other forms of dyspnea: Secondary | ICD-10-CM

## 2019-12-22 MED ORDER — VITAMIN D (ERGOCALCIFEROL) 1.25 MG (50000 UNIT) PO CAPS: 50000.0000 [IU] | ORAL_CAPSULE | ORAL | 6 refills | Status: AC

## 2019-12-22 MED ORDER — FLUTICASONE PROPIONATE 50 MCG/ACT NA SUSP: 2.0000 | Freq: Every day | NASAL | 6 refills | Status: AC

## 2019-12-22 MED ORDER — DOXYCYCLINE HYCLATE 100 MG PO TABS: 100.0000 mg | ORAL_TABLET | Freq: Two times a day (BID) | ORAL | 0 refills | Status: DC

## 2019-12-22 MED ORDER — ALBUTEROL SULFATE HFA 108 (90 BASE) MCG/ACT IN AERS: 2.0000 | INHALATION_SPRAY | Freq: Four times a day (QID) | RESPIRATORY_TRACT | 11 refills | Status: AC | PRN

## 2019-12-22 NOTE — Telephone Encounter (Signed)
Stephanie Baldwin call for her X-ray results. Please advise.

## 2020-01-01 DIAGNOSIS — R600 Localized edema: Secondary | ICD-10-CM

## 2020-01-01 DIAGNOSIS — J9612 Chronic respiratory failure with hypercapnia: Secondary | ICD-10-CM | POA: Diagnosis not present

## 2020-01-01 DIAGNOSIS — J449 Chronic obstructive pulmonary disease, unspecified: Secondary | ICD-10-CM | POA: Diagnosis not present

## 2020-01-01 HISTORY — DX: Localized edema: R60.0

## 2020-01-31 DIAGNOSIS — J449 Chronic obstructive pulmonary disease, unspecified: Secondary | ICD-10-CM | POA: Diagnosis not present

## 2020-01-31 DIAGNOSIS — J9612 Chronic respiratory failure with hypercapnia: Secondary | ICD-10-CM | POA: Diagnosis not present

## 2020-02-06 ENCOUNTER — Telehealth: Payer: Self-pay | Admitting: Family Medicine

## 2020-02-06 NOTE — Telephone Encounter (Signed)
Pt scheduled for a TELE appointment on 5/12 with you. Daughter states her mother is agoraphobic, so TELE would be the best. Pt is experiencing swelling in ankles and feet. Can you please confirm whether you are okay with this being a TELE visit.

## 2020-02-11 ENCOUNTER — Encounter: Payer: Self-pay | Admitting: Family Medicine

## 2020-02-11 ENCOUNTER — Ambulatory Visit (INDEPENDENT_AMBULATORY_CARE_PROVIDER_SITE_OTHER): Payer: Medicaid Other | Admitting: Family Medicine

## 2020-02-11 ENCOUNTER — Other Ambulatory Visit: Payer: Self-pay

## 2020-02-11 DIAGNOSIS — M549 Dorsalgia, unspecified: Secondary | ICD-10-CM

## 2020-02-11 DIAGNOSIS — M25552 Pain in left hip: Secondary | ICD-10-CM | POA: Diagnosis not present

## 2020-02-11 DIAGNOSIS — R0609 Other forms of dyspnea: Secondary | ICD-10-CM

## 2020-02-11 DIAGNOSIS — R600 Localized edema: Secondary | ICD-10-CM | POA: Diagnosis not present

## 2020-02-11 DIAGNOSIS — E559 Vitamin D deficiency, unspecified: Secondary | ICD-10-CM | POA: Diagnosis not present

## 2020-02-11 DIAGNOSIS — R0600 Dyspnea, unspecified: Secondary | ICD-10-CM

## 2020-02-11 DIAGNOSIS — G8929 Other chronic pain: Secondary | ICD-10-CM

## 2020-02-11 DIAGNOSIS — Z09 Encounter for follow-up examination after completed treatment for conditions other than malignant neoplasm: Secondary | ICD-10-CM | POA: Diagnosis not present

## 2020-02-11 MED ORDER — SPIRONOLACTONE 25 MG PO TABS: 25.0000 mg | ORAL_TABLET | Freq: Two times a day (BID) | ORAL | 2 refills | Status: DC

## 2020-02-11 NOTE — Progress Notes (Signed)
Virtual Visit via Telephone Note  I connected with Stephanie Baldwin on 02/11/20 at  3:20 PM EDT by telephone and verified that I am speaking with the correct person using two identifiers.   I discussed the limitations, risks, security and privacy concerns of performing an evaluation and management service by telephone and the availability of in person appointments. I also discussed with the patient that there may be a patient responsible charge related to this service. The patient expressed understanding and agreed to proceed.   History of Present Illness: Past Surgical History:  Procedure Laterality Date  . CESAREAN SECTION  1986  . CESAREAN SECTION  1989    Social History   Socioeconomic History  . Marital status: Divorced    Spouse name: Not on file  . Number of children: Not on file  . Years of education: Not on file  . Highest education level: Not on file  Occupational History  . Not on file  Tobacco Use  . Smoking status: Current Every Day Smoker    Packs/day: 1.00    Years: 43.00    Pack years: 43.00    Types: Cigarettes    Last attempt to quit: 01/09/2018    Years since quitting: 2.0  . Smokeless tobacco: Never Used  Substance and Sexual Activity  . Alcohol use: Not Currently  . Drug use: Not Currently  . Sexual activity: Not Currently  Other Topics Concern  . Not on file  Social History Narrative  . Not on file   Social Determinants of Health   Financial Resource Strain:   . Difficulty of Paying Living Expenses:   Food Insecurity:   . Worried About Charity fundraiser in the Last Year:   . Arboriculturist in the Last Year:   Transportation Needs:   . Film/video editor (Medical):   Marland Kitchen Lack of Transportation (Non-Medical):   Physical Activity:   . Days of Exercise per Week:   . Minutes of Exercise per Session:   Stress:   . Feeling of Stress :   Social Connections:   . Frequency of Communication with Friends and Family:   . Frequency of Social  Gatherings with Friends and Family:   . Attends Religious Services:   . Active Member of Clubs or Organizations:   . Attends Archivist Meetings:   Marland Kitchen Marital Status:   Intimate Partner Violence:   . Fear of Current or Ex-Partner:   . Emotionally Abused:   Marland Kitchen Physically Abused:   . Sexually Abused:     Allergies  Allergen Reactions  . Guaifenesin & Derivatives   . Paxil [Paroxetine]   . Penicillins   . Sulfa Antibiotics     Past Medical History:  Diagnosis Date  . Adrenal insufficiency ([place]) 01/16/2018  . Agoraphobia   . Depression   . Obesity, Class III, BMI 40-49.9 (morbid obesity) ([place]) 01/16/2018  . OSA (obstructive sleep apnea) 01/16/2018  . Tobacco abuse   . Vitamin D deficiency 12/2019   Current Status: Since her last office visit, she is doing well with no complaints. She continues to have c/o bilateral lower extremity edema, which she states that lasix is not effective. She is currently smokes 1 pack of cigarettes a day. She denies fevers, chills, fatigue, recent infections, weight loss, and night sweats. She has not had any headaches, visual changes, dizziness, and falls. No chest pain, heart palpitations, cough and shortness of breath reported. Denies GI problems such as nausea,  vomiting, diarrhea, and constipation. She has no reports of blood in stools, dysuria and hematuria. No depression or anxiety, and denies suicidal ideations, homicidal ideations, or auditory hallucinations. She is taking all medications as prescribed. She continues to have chronic back pain.   Observations/Objective: Telephone Virtual Visit   Assessment and Plan:  1. Bilateral lower extremity edema  2. DOE (dyspnea on exertion) - spironolactone (ALDACTONE) 25 MG tablet; Take 1 tablet (25 mg total) by mouth 2 (two) times daily.  Dispense: 60 tablet; Refill: 2  3. Chronic back pain, unspecified back location, unspecified back pain laterality  4. Chronic left hip pain  5. Vitamin D  deficiency  Meds ordered this encounter  Medications  . spironolactone (ALDACTONE) 25 MG tablet    Sig: Take 1 tablet (25 mg total) by mouth 2 (two) times daily.    Dispense:  60 tablet    Refill:  2    No orders of the defined types were placed in this encounter.   Referral Orders  No referral(s) requested today    Raliegh Ip,  MSN, FNP-BC [facility] [address] [place], [place] [address] [phone] fax    Follow Up Instructions:  She will keep follow up appointment.     I discussed the assessment and treatment plan with the patient. The patient was provided an opportunity to ask questions and all were answered. The patient agreed with the plan and demonstrated an understanding of the instructions.   The patient was advised to call back or seek an in-person evaluation if the symptoms worsen or if the condition fails to improve as anticipated.  I provided 20 minutes of non-face-to-face time during this encounter.   Kallie Locks, FNP

## 2020-03-02 DIAGNOSIS — J9612 Chronic respiratory failure with hypercapnia: Secondary | ICD-10-CM | POA: Diagnosis not present

## 2020-03-02 DIAGNOSIS — J449 Chronic obstructive pulmonary disease, unspecified: Secondary | ICD-10-CM | POA: Diagnosis not present

## 2020-03-15 ENCOUNTER — Ambulatory Visit (INDEPENDENT_AMBULATORY_CARE_PROVIDER_SITE_OTHER): Payer: Medicaid Other | Admitting: Family Medicine

## 2020-03-15 ENCOUNTER — Other Ambulatory Visit: Payer: Self-pay

## 2020-03-15 ENCOUNTER — Encounter: Payer: Self-pay | Admitting: Family Medicine

## 2020-03-15 VITALS — BP 106/68 | HR 112 | Temp 97.2°F | Ht 62.0 in | Wt 250.4 lb

## 2020-03-15 DIAGNOSIS — M549 Dorsalgia, unspecified: Secondary | ICD-10-CM | POA: Diagnosis not present

## 2020-03-15 DIAGNOSIS — R600 Localized edema: Secondary | ICD-10-CM | POA: Diagnosis not present

## 2020-03-15 DIAGNOSIS — Z79899 Other long term (current) drug therapy: Secondary | ICD-10-CM | POA: Diagnosis not present

## 2020-03-15 DIAGNOSIS — Z09 Encounter for follow-up examination after completed treatment for conditions other than malignant neoplasm: Secondary | ICD-10-CM

## 2020-03-15 DIAGNOSIS — G8929 Other chronic pain: Secondary | ICD-10-CM

## 2020-03-15 DIAGNOSIS — M62830 Muscle spasm of back: Secondary | ICD-10-CM | POA: Diagnosis not present

## 2020-03-15 DIAGNOSIS — F419 Anxiety disorder, unspecified: Secondary | ICD-10-CM

## 2020-03-15 DIAGNOSIS — Z716 Tobacco abuse counseling: Secondary | ICD-10-CM

## 2020-03-15 MED ORDER — BUPROPION HCL ER (XL) 150 MG PO TB24: 150.0000 mg | ORAL_TABLET | Freq: Every day | ORAL | 6 refills | Status: AC

## 2020-03-15 MED ORDER — CYCLOBENZAPRINE HCL 10 MG PO TABS: 10.0000 mg | ORAL_TABLET | Freq: Three times a day (TID) | ORAL | 3 refills | Status: AC | PRN

## 2020-03-15 NOTE — Patient Instructions (Signed)
Generalized Anxiety Disorder, Adult Generalized anxiety disorder (GAD) is a mental health disorder. People with this condition constantly worry about everyday events. Unlike normal anxiety, worry related to GAD is not triggered by a specific event. These worries also do not fade or get better with time. GAD interferes with life functions, including relationships, work, and school. GAD can vary from mild to severe. People with severe GAD can have intense waves of anxiety with physical symptoms (panic attacks). What are the causes? The exact cause of GAD is not known. What increases the risk? This condition is more likely to develop in:  Women.  People who have a family history of anxiety disorders.  People who are very shy.  People who experience very stressful life events, such as the death of a loved one.  People who have a very stressful family environment. What are the signs or symptoms? People with GAD often worry excessively about many things in their lives, such as their health and family. They may also be overly concerned about:  Doing well at work.  Being on time.  Natural disasters.  Friendships. Physical symptoms of GAD include:  Fatigue.  Muscle tension or having muscle twitches.  Trembling or feeling shaky.  Being easily startled.  Feeling like your heart is pounding or racing.  Feeling out of breath or like you cannot take a deep breath.  Having trouble falling asleep or staying asleep.  Sweating.  Nausea, diarrhea, or irritable bowel syndrome (IBS).  Headaches.  Trouble concentrating or remembering facts.  Restlessness.  Irritability. How is this diagnosed? Your health care provider can diagnose GAD based on your symptoms and medical history. You will also have a physical exam. The health care provider will ask specific questions about your symptoms, including how severe they are, when they started, and if they come and go. Your health care  provider may ask you about your use of alcohol or drugs, including prescription medicines. Your health care provider may refer you to a mental health specialist for further evaluation. Your health care provider will do a thorough examination and may perform additional tests to rule out other possible causes of your symptoms. To be diagnosed with GAD, a person must have anxiety that:  Is out of his or her control.  Affects several different aspects of his or her life, such as work and relationships.  Causes distress that makes him or her unable to take part in normal activities.  Includes at least three physical symptoms of GAD, such as restlessness, fatigue, trouble concentrating, irritability, muscle tension, or sleep problems. Before your health care provider can confirm a diagnosis of GAD, these symptoms must be present more days than they are not, and they must last for six months or longer. How is this treated? The following therapies are usually used to treat GAD:  Medicine. Antidepressant medicine is usually prescribed for long-term daily control. Antianxiety medicines may be added in severe cases, especially when panic attacks occur.  Talk therapy (psychotherapy). Certain types of talk therapy can be helpful in treating GAD by providing support, education, and guidance. Options include: ? Cognitive behavioral therapy (CBT). People learn coping skills and techniques to ease their anxiety. They learn to identify unrealistic or negative thoughts and behaviors and to replace them with positive ones. ? Acceptance and commitment therapy (ACT). This treatment teaches people how to be mindful as a way to cope with unwanted thoughts and feelings. ? Biofeedback. This process trains you to manage your body's response (  physiological response) through breathing techniques and relaxation methods. You will work with a therapist while machines are used to monitor your physical symptoms.  Stress  management techniques. These include yoga, meditation, and exercise. A mental health specialist can help determine which treatment is best for you. Some people see improvement with one type of therapy. However, other people require a combination of therapies. Follow these instructions at home:  Take over-the-counter and prescription medicines only as told by your health care provider.  Try to maintain a normal routine.  Try to anticipate stressful situations and allow extra time to manage them.  Practice any stress management or self-calming techniques as taught by your health care provider.  Do not punish yourself for setbacks or for not making progress.  Try to recognize your accomplishments, even if they are small.  Keep all follow-up visits as told by your health care provider. This is important. Contact a health care provider if:  Your symptoms do not get better.  Your symptoms get worse.  You have signs of depression, such as: ? A persistently sad, cranky, or irritable mood. ? Loss of enjoyment in activities that used to bring you joy. ? Change in weight or eating. ? Changes in sleeping habits. ? Avoiding friends or family members. ? Loss of energy for normal tasks. ? Feelings of guilt or worthlessness. Get help right away if:  You have serious thoughts about hurting yourself or others. If you ever feel like you may hurt yourself or others, or have thoughts about taking your own life, get help right away. You can go to your nearest emergency department or call:  Your local emergency services (911 in the [place]  A suicide crisis helpline, such as the National Suicide Prevention Lifeline at (330)848-5098. This is open 24 hours a day. Summary  Generalized anxiety disorder (GAD) is a mental health disorder that involves worry that is not triggered by a specific event.  People with GAD often worry excessively about many things in their lives, such as their health and  family.  GAD may cause physical symptoms such as restlessness, trouble concentrating, sleep problems, frequent sweating, nausea, diarrhea, headaches, and trembling or muscle twitching.  A mental health specialist can help determine which treatment is best for you. Some people see improvement with one type of therapy. However, other people require a combination of therapies. This information is not intended to replace advice given to you by your health care provider. Make sure you discuss any questions you have with your health care provider. Document Revised: 08/31/2017 Document Reviewed: 08/08/2016 Elsevier Patient Education  2020 [employer] Calorie Counting for [employer] Loss Calories are units of energy. Your body needs a certain amount of calories from food to keep you going throughout the day. When you eat more calories than your body needs, your body stores the extra calories as fat. When you eat fewer calories than your body needs, your body burns fat to get the energy it needs. Calorie counting means keeping track of how many calories you eat and drink each day. Calorie counting can be helpful if you need to lose weight. If you make sure to eat fewer calories than your body needs, you should lose weight. Ask your health care provider what a healthy weight is for you. For calorie counting to work, you will need to eat the right number of calories in a day in order to lose a healthy amount of weight per week. A dietitian can help you determine  how many calories you need in a day and will give you suggestions on how to reach your calorie goal.  A healthy amount of weight to lose per week is usually 1-2 lb (0.5-0.9 kg). This usually means that your daily calorie intake should be reduced by 500-750 calories.  Eating 1,200 - 1,500 calories per day can help most women lose weight.  Eating 1,500 - 1,800 calories per day can help most men lose weight. What is my plan? My goal is to have __________  calories per day. If I have this many calories per day, I should lose around __________ pounds per week. What do I need to know about calorie counting? In order to meet your daily calorie goal, you will need to:  Find out how many calories are in each food you would like to eat. Try to do this before you eat.  Decide how much of the food you plan to eat.  Write down what you ate and how many calories it had. Doing this is called keeping a food log. To successfully lose weight, it is important to balance calorie counting with a healthy lifestyle that includes regular activity. Aim for 150 minutes of moderate exercise (such as walking) or 75 minutes of vigorous exercise (such as running) each week. Where do I find calorie information?  The number of calories in a food can be found on a Nutrition Facts label. If a food does not have a Nutrition Facts label, try to look up the calories online or ask your dietitian for help. Remember that calories are listed per serving. If you choose to have more than one serving of a food, you will have to multiply the calories per serving by the amount of servings you plan to eat. For example, the label on a package of bread might say that a serving size is 1 slice and that there are 90 calories in a serving. If you eat 1 slice, you will have eaten 90 calories. If you eat 2 slices, you will have eaten 180 calories. How do I keep a food log? Immediately after each meal, record the following information in your food log:  What you ate. Don't forget to include toppings, sauces, and other extras on the food.  How much you ate. This can be measured in cups, ounces, or number of items.  How many calories each food and drink had.  The total number of calories in the meal. Keep your food log near you, such as in a small notebook in your pocket, or use a mobile app or website. Some programs will calculate calories for you and show you how many calories you have left  for the day to meet your goal. What are some calorie counting tips?   Use your calories on foods and drinks that will fill you up and not leave you hungry: ? Some examples of foods that fill you up are nuts and nut butters, vegetables, lean proteins, and high-fiber foods like whole grains. High-fiber foods are foods with more than 5 g fiber per serving. ? Drinks such as sodas, specialty coffee drinks, alcohol, and juices have a lot of calories, yet do not fill you up.  Eat nutritious foods and avoid empty calories. Empty calories are calories you get from foods or beverages that do not have many vitamins or protein, such as candy, sweets, and soda. It is better to have a nutritious high-calorie food (such as an avocado) than a food with few  nutrients (such as a bag of chips).  Know how many calories are in the foods you eat most often. This will help you calculate calorie counts faster.  Pay attention to calories in drinks. Low-calorie drinks include water and unsweetened drinks.  Pay attention to nutrition labels for "low fat" or "fat free" foods. These foods sometimes have the same amount of calories or more calories than the full fat versions. They also often have added sugar, starch, or salt, to make up for flavor that was removed with the fat.  Find a way of tracking calories that works for you. Get creative. Try different apps or programs if writing down calories does not work for you. What are some portion control tips?  Know how many calories are in a serving. This will help you know how many servings of a certain food you can have.  Use a measuring cup to measure serving sizes. You could also try weighing out portions on a kitchen scale. With time, you will be able to estimate serving sizes for some foods.  Take some time to put servings of different foods on your favorite plates, bowls, and cups so you know what a serving looks like.  Try not to eat straight from a bag or box.  Doing this can lead to overeating. Put the amount you would like to eat in a cup or on a plate to make sure you are eating the right portion.  Use smaller plates, glasses, and bowls to prevent overeating.  Try not to multitask (for example, watch TV or use your computer) while eating. If it is time to eat, sit down at a table and enjoy your food. This will help you to know when you are full. It will also help you to be aware of what you are eating and how much you are eating. What are tips for following this plan? Reading food labels  Check the calorie count compared to the serving size. The serving size may be smaller than what you are used to eating.  Check the source of the calories. Make sure the food you are eating is high in vitamins and protein and low in saturated and trans fats. Shopping  Read nutrition labels while you shop. This will help you make healthy decisions before you decide to purchase your food.  Make a grocery list and stick to it. Cooking  Try to cook your favorite foods in a healthier way. For example, try baking instead of frying.  Use low-fat dairy products. Meal planning  Use more fruits and vegetables. Half of your plate should be fruits and vegetables.  Include lean proteins like poultry and fish. How do I count calories when eating out?  Ask for smaller portion sizes.  Consider sharing an entree and sides instead of getting your own entree.  If you get your own entree, eat only half. Ask for a box at the beginning of your meal and put the rest of your entree in it so you are not tempted to eat it.  If calories are listed on the menu, choose the lower calorie options.  Choose dishes that include vegetables, fruits, whole grains, low-fat dairy products, and lean protein.  Choose items that are boiled, broiled, grilled, or steamed. Stay away from items that are buttered, battered, fried, or served with cream sauce. Items labeled "crispy" are usually  fried, unless stated otherwise.  Choose water, low-fat milk, unsweetened iced tea, or other drinks without added sugar. If you  want an alcoholic beverage, choose a lower calorie option such as a glass of wine or light beer.  Ask for dressings, sauces, and syrups on the side. These are usually high in calories, so you should limit the amount you eat.  If you want a salad, choose a garden salad and ask for grilled meats. Avoid extra toppings like bacon, cheese, or fried items. Ask for the dressing on the side, or ask for olive oil and vinegar or lemon to use as dressing.  Estimate how many servings of a food you are given. For example, a serving of cooked rice is  cup or about the size of half a baseball. Knowing serving sizes will help you be aware of how much food you are eating at restaurants. The list below tells you how big or small some common portion sizes are based on everyday objects: ? 1 oz-4 stacked dice. ? 3 oz-1 deck of cards. ? 1 tsp-1 die. ? 1 Tbsp- a ping-pong ball. ? 2 Tbsp-1 ping-pong ball. ?  cup- baseball. ? 1 cup-1 baseball. Summary  Calorie counting means keeping track of how many calories you eat and drink each day. If you eat fewer calories than your body needs, you should lose weight.  A healthy amount of weight to lose per week is usually 1-2 lb (0.5-0.9 kg). This usually means reducing your daily calorie intake by 500-750 calories.  The number of calories in a food can be found on a Nutrition Facts label. If a food does not have a Nutrition Facts label, try to look up the calories online or ask your dietitian for help.  Use your calories on foods and drinks that will fill you up, and not on foods and drinks that will leave you hungry.  Use smaller plates, glasses, and bowls to prevent overeating. This information is not intended to replace advice given to you by your health care provider. Make sure you discuss any questions you have with your health care  provider. Document Revised: 06/07/2018 Document Reviewed: 08/18/2016 Elsevier Patient Education  2020 [employer]. Bupropion sustained-release tablets (smoking cessation) What is this medicine? BUPROPION (byoo PROE pee on) is used to help people quit smoking. This medicine may be used for other purposes; ask your health care provider or pharmacist if you have questions. COMMON BRAND NAME(S): Buproban, Zyban What should I tell my health care provider before I take this medicine? They need to know if you have any of these conditions:  an eating disorder, such as anorexia or bulimia  bipolar disorder or psychosis  diabetes or high blood sugar, treated with medication  glaucoma  head injury or brain tumor  heart disease, previous heart attack, or irregular heart beat  high blood pressure  kidney or liver disease  seizures  suicidal thoughts or a previous suicide attempt  Tourette's syndrome  weight loss  an unusual or allergic reaction to bupropion, other medicines, foods, dyes, or preservatives  breast-feeding  pregnant or trying to become pregnant How should I use this medicine? Take this medicine by mouth with a glass of water. Follow the directions on the prescription label. You can take it with or without food. If it upsets your stomach, take it with food. Do not cut, crush or chew this medicine. Take your medicine at regular intervals. If you take this medicine more than once a day, take your second dose at least 8 hours after you take your first dose. To limit difficulty in sleeping, avoid taking  this medicine at bedtime. Do not take your medicine more often than directed. Do not stop taking this medicine suddenly except upon the advice of your doctor. Stopping this medicine too quickly may cause serious side effects. A special MedGuide will be given to you by the pharmacist with each prescription and refill. Be sure to read this information carefully each time. Talk  to your pediatrician regarding the use of this medicine in children. Special care may be needed. Overdosage: If you think you have taken too much of this medicine contact a poison control center or emergency room at once. NOTE: This medicine is only for you. Do not share this medicine with others. What if I miss a dose? If you miss a dose, skip the missed dose and take your next tablet at the regular time. There should be at least 8 hours between doses. Do not take double or extra doses. What may interact with this medicine? Do not take this medicine with any of the following medications:  linezolid  MAOIs like Azilect, Carbex, Eldepryl, Marplan, Nardil, and Parnate  methylene blue (injected into a vein)  other medicines that contain bupropion like Wellbutrin This medicine may also interact with the following medications:  alcohol  certain medicines for anxiety or sleep  certain medicines for blood pressure like metoprolol, propranolol  certain medicines for depression or psychotic disturbances  certain medicines for HIV or AIDS like efavirenz, lopinavir, nelfinavir, ritonavir  certain medicines for irregular heart beat like propafenone, flecainide  certain medicines for Parkinson's disease like amantadine, levodopa  certain medicines for seizures like carbamazepine, phenytoin, phenobarbital  cimetidine  clopidogrel  cyclophosphamide  digoxin  furazolidone  isoniazid  nicotine  orphenadrine  procarbazine  steroid medicines like prednisone or cortisone  stimulant medicines for attention disorders, weight loss, or to stay awake  tamoxifen  theophylline  thiotepa  ticlopidine  tramadol  warfarin This list may not describe all possible interactions. Give your health care provider a list of all the medicines, herbs, non-prescription drugs, or dietary supplements you use. Also tell them if you smoke, drink alcohol, or use illegal drugs. Some items may  interact with your medicine. What should I watch for while using this medicine? Visit your doctor or healthcare provider for regular checks on your progress. This medicine should be used together with a patient support program. It is important to participate in a behavioral program, counseling, or other support program that is recommended by your healthcare provider. This medicine may cause serious skin reactions. They can happen weeks to months after starting the medicine. Contact your healthcare provider right away if you notice fevers or flu-like symptoms with a rash. The rash may be red or purple and then turn into blisters or peeling of the skin. Or, you might notice a red rash with swelling of the face, lips or lymph nodes in your neck or under your arms. Patients and their families should watch out for new or worsening thoughts of suicide or depression. Also watch out for sudden changes in feelings such as feeling anxious, agitated, panicky, irritable, hostile, aggressive, impulsive, severely restless, overly excited and hyperactive, or not being able to sleep. If this happens, especially at the beginning of treatment or after a change in dose, call your healthcare provider. Avoid alcoholic drinks while taking this medicine. Drinking excessive alcoholic beverages, using sleeping or anxiety medicines, or quickly stopping the use of these agents while taking this medicine may increase your risk for a seizure. Do not drive  or use heavy machinery until you know how this medicine affects you. This medicine can impair your ability to perform these tasks. Do not take this medicine close to bedtime. It may prevent you from sleeping. Your mouth may get dry. Chewing sugarless gum or sucking hard candy, and drinking plenty of water may help. Contact your doctor if the problem does not go away or is severe. Do not use nicotine patches or chewing gum without the advice of your doctor or healthcare provider while  taking this medicine. You may need to have your blood pressure taken regularly if your doctor recommends that you use both nicotine and this medicine together. What side effects may I notice from receiving this medicine? Side effects that you should report to your doctor or health care professional as soon as possible:  allergic reactions like skin rash, itching or hives, swelling of the face, lips, or tongue  breathing problems  changes in vision  confusion  elevated mood, decreased need for sleep, racing thoughts, impulsive behavior  fast or irregular heartbeat  hallucinations, loss of contact with reality  increased blood pressure  rash, fever, and swollen lymph nodes  redness, blistering, peeling, or loosening of the skin, including inside the mouth  seizures  suicidal thoughts or other mood changes  unusually weak or tired  vomiting Side effects that usually do not require medical attention (report to your doctor or health care professional if they continue or are bothersome):  constipation  headache  loss of appetite  nausea  tremors  weight loss This list may not describe all possible side effects. Call your doctor for medical advice about side effects. You may report side effects to FDA at 1-800-FDA-1088. Where should I keep my medicine? Keep out of the reach of children. Store at room temperature between 20 and 25 degrees C (68 and 77 degrees F). Protect from light. Keep container tightly closed. Throw away any unused medicine after the expiration date. NOTE: This sheet is a summary. It may not cover all possible information. If you have questions about this medicine, talk to your doctor, pharmacist, or health care provider.  2020 Elsevier/Gold Standard (2018-12-12 13:59:09) Cyclobenzaprine tablets What is this medicine? CYCLOBENZAPRINE (sye kloe BEN za preen) is a muscle relaxer. It is used to treat muscle pain, spasms, and stiffness. This medicine may  be used for other purposes; ask your health care provider or pharmacist if you have questions. COMMON BRAND NAME(S): Fexmid, Flexeril What should I tell my health care provider before I take this medicine? They need to know if you have any of these conditions:  heart disease, irregular heartbeat, or previous heart attack  liver disease  thyroid problem  an unusual or allergic reaction to cyclobenzaprine, tricyclic antidepressants, lactose, other medicines, foods, dyes, or preservatives  pregnant or trying to get pregnant  breast-feeding How should I use this medicine? Take this medicine by mouth with a glass of water. Follow the directions on the prescription label. If this medicine upsets your stomach, take it with food or milk. Take your medicine at regular intervals. Do not take it more often than directed. Talk to your pediatrician regarding the use of this medicine in children. Special care may be needed. Overdosage: If you think you have taken too much of this medicine contact a poison control center or emergency room at once. NOTE: This medicine is only for you. Do not share this medicine with others. What if I miss a dose? If you miss a  dose, take it as soon as you can. If it is almost time for your next dose, take only that dose. Do not take double or extra doses. What may interact with this medicine? Do not take this medicine with any of the following medications:  MAOIs like Carbex, Eldepryl, Marplan, Nardil, and Parnate  narcotic medicines for cough  safinamide This medicine may also interact with the following medications:  alcohol  bupropion  antihistamines for allergy, cough and cold  certain medicines for anxiety or sleep  certain medicines for bladder problems like oxybutynin, tolterodine  certain medicines for depression like amitriptyline, fluoxetine, sertraline  certain medicines for Parkinson's disease like benztropine, trihexyphenidyl  certain  medicines for seizures like phenobarbital, primidone  certain medicines for stomach problems like dicyclomine, hyoscyamine  certain medicines for travel sickness like scopolamine  general anesthetics like halothane, isoflurane, methoxyflurane, propofol  ipratropium  local anesthetics like lidocaine, pramoxine, tetracaine  medicines that relax muscles for surgery  narcotic medicines for pain  phenothiazines like chlorpromazine, mesoridazine, prochlorperazine, thioridazine  verapamil This list may not describe all possible interactions. Give your health care provider a list of all the medicines, herbs, non-prescription drugs, or dietary supplements you use. Also tell them if you smoke, drink alcohol, or use illegal drugs. Some items may interact with your medicine. What should I watch for while using this medicine? Tell your doctor or health care professional if your symptoms do not start to get better or if they get worse. You may get drowsy or dizzy. Do not drive, use machinery, or do anything that needs mental alertness until you know how this medicine affects you. Do not stand or sit up quickly, especially if you are an older patient. This reduces the risk of dizzy or fainting spells. Alcohol may interfere with the effect of this medicine. Avoid alcoholic drinks. If you are taking another medicine that also causes drowsiness, you may have more side effects. Give your health care provider a list of all medicines you use. Your doctor will tell you how much medicine to take. Do not take more medicine than directed. Call emergency for help if you have problems breathing or unusual sleepiness. Your mouth may get dry. Chewing sugarless gum or sucking hard candy, and drinking plenty of water may help. Contact your doctor if the problem does not go away or is severe. What side effects may I notice from receiving this medicine? Side effects that you should report to your doctor or health care  professional as soon as possible:  allergic reactions like skin rash, itching or hives, swelling of the face, lips, or tongue  breathing problems  chest pain  fast, irregular heartbeat  hallucinations  seizures  unusually weak or tired Side effects that usually do not require medical attention (report to your doctor or health care professional if they continue or are bothersome):  headache  nausea, vomiting This list may not describe all possible side effects. Call your doctor for medical advice about side effects. You may report side effects to FDA at 1-800-FDA-1088. Where should I keep my medicine? Keep out of the reach of children. Store at room temperature between 15 and 30 degrees C (59 and 86 degrees F). Keep container tightly closed. Throw away any unused medicine after the expiration date. NOTE: This sheet is a summary. It may not cover all possible information. If you have questions about this medicine, talk to your doctor, pharmacist, or health care provider.  2020 Elsevier/Gold Standard (2018-08-21 12:49:26) Muscle  Cramps and Spasms Muscle cramps and spasms are when muscles tighten by themselves. They usually get better within minutes. Muscle cramps are painful. They are usually stronger and last longer than muscle spasms. Muscle spasms may or may not be painful. They can last a few seconds or much longer. Cramps and spasms can affect any muscle, but they occur most often in the calf muscles of the leg. They are usually not caused by a serious problem. In many cases, the cause is not known. Some common causes include:  Doing more physical work or exercise than your body is ready for.  Using the muscles too much (overuse) by repeating certain movements too many times.  Staying in a certain position for a long time.  Playing a sport or doing an activity without preparing properly.  Using bad form or technique while playing a sport or doing an activity.  Not having  enough water in your body (dehydration).  Injury.  Side effects of some medicines.  Low levels of the salts and minerals in your blood (electrolytes), such as low potassium or calcium. Follow these instructions at home: Managing pain and stiffness      Massage, stretch, and relax the muscle. Do this for many minutes at a time.  If told, put heat on tight or tense muscles as often as told by your doctor. Use the heat source that your doctor recommends, such as a moist heat pack or a heating pad. ? Place a towel between your skin and the heat source. ? Leave the heat on for 20-30 minutes. ? Remove the heat if your skin turns bright red. This is very important if you are not able to feel pain, heat, or cold. You may have a greater risk of getting burned.  If told, put ice on the affected area. This may help if you are sore or have pain after a cramp or spasm. ? Put ice in a plastic bag. ? Place a towel between your skin and the bag. ? Leave the ice on for 20 minutes, 2-3 times a day.  Try taking hot showers or baths to help relax tight muscles. Eating and drinking  Drink enough fluid to keep your pee (urine) pale yellow.  Eat a healthy diet to help ensure that your muscles work well. This should include: ? Fruits and vegetables. ? Lean protein. ? Whole grains. ? Low-fat or nonfat dairy products. General instructions  If you are having cramps often, avoid intense exercise for several days.  Take over-the-counter and prescription medicines only as told by your doctor.  Watch for any changes in your symptoms.  Keep all follow-up visits as told by your doctor. This is important. Contact a doctor if:  Your cramps or spasms get worse or happen more often.  Your cramps or spasms do not get better with time. Summary  Muscle cramps and spasms are when muscles tighten by themselves. They usually get better within minutes.  Cramps and spasms occur most often in the calf muscles  of the leg.  Massage, stretch, and relax the muscle. This may help the cramp or spasm go away.  Drink enough fluid to keep your pee (urine) pale yellow. This information is not intended to replace advice given to you by your health care provider. Make sure you discuss any questions you have with your health care provider. Document Revised: 02/11/2018 Document Reviewed: 02/11/2018 Elsevier Patient Education  2020 [employer].

## 2020-03-15 NOTE — Progress Notes (Signed)
[facility]   Established Patient Office Visit  Subjective:  Patient ID: Stephanie Baldwin, female    DOB: [birth year]  Age: 59 y.o. MRN: [id number]  CC:  Chief Complaint  Patient presents with  . Follow-up    HPI Stephanie Baldwin is a 59 year old female who presents for Follow Up today.   Past Medical History:  Diagnosis Date  . Adrenal insufficiency (HCC) 01/16/2018  . Agoraphobia   . Bilateral lower extremity edema 01/2020  . Depression   . Obesity, Class III, BMI 40-49.9 (morbid obesity) (HCC) 01/16/2018  . OSA (obstructive sleep apnea) 01/16/2018  . Tobacco abuse   . Vitamin D deficiency 12/2019    Patient Active Problem List   Diagnosis Date Noted  . Bilateral lower extremity edema 02/11/2020  . Leg swelling bilaterally, chronic, L >R  03/09/2018  . Upper airway cough syndrome 03/08/2018  . Solitary pulmonary nodule on lung CT 02/14/2018  . Chronic respiratory failure with hypoxia and hypercapnia (HCC) 02/12/2018  . Cor pulmonale, chronic (HCC) 02/12/2018  . DOE (dyspnea on exertion) 02/11/2018  . OSA (obstructive sleep apnea) 01/16/2018  . Acute pulmonary edema (HCC) 01/16/2018  . Adrenal insufficiency (HCC) 01/16/2018  . Thrombocytopenia (HCC) 01/16/2018  . Cigarette smoker 01/16/2018  . Obesity, Class III, BMI 40-49.9 (morbid obesity) (HCC) 01/16/2018  . Acute respiratory failure with hypoxia and hypercapnia (HCC) 01/09/2018  . Acute encephalopathy 01/09/2018  . Agoraphobia 01/09/2018  . Hypomagnesemia 01/09/2018  . Hyponatremia   . Fluid overload 01/08/2018    Current Status: Since her last office visit, she continues to have increased back pain and muscle spasms. She is accompanied by her daughter today. She continues to use 2 liters of oxygen as needed on exertion and QHS. Denies chest pain, heart palpitations, cough and shortness of breath reported. She continues to ambulate via rolling walker. Her anxiety is  moderate today. She denies suicidal ideations, homicidal ideations, or auditory hallucinations. She denies fevers, chills, fatigue, recent infections, weight loss, and night sweats. She has not had any headaches, visual changes, dizziness, and falls.  Denies GI problems such as nausea, vomiting, diarrhea, and constipation. She has no reports of blood in stools, dysuria and hematuria. She is taking all medications as prescribed.   Past Surgical History:  Procedure Laterality Date  . CESAREAN SECTION  1986  . CESAREAN SECTION  1989    Family History  Problem Relation Age of Onset  . Cancer Mother   . Diabetes Mother   . Heart disease Mother   . Hypothyroidism Mother   . Stroke Mother   . Cancer Father     Social History   Socioeconomic History  . Marital status: Divorced    Spouse name: Not on file  . Number of children: Not on file  . Years of education: Not on file  . Highest education level: Not on file  Occupational History  . Not on file  Tobacco Use  . Smoking status: Current Every Day Smoker    Packs/day: 1.00    Years: 43.00    Pack years: 43.00    Types: Cigarettes    Last attempt to quit: 01/09/2018    Years since quitting: 2.1  . Smokeless tobacco: Never Used  Vaping Use  . Vaping Use: Never used  Substance and Sexual Activity  . Alcohol use: Not Currently  . Drug use: Not Currently  . Sexual activity: Not Currently  Other Topics Concern  .  Not on file  Social History Narrative  . Not on file   Social Determinants of Health   Financial Resource Strain:   . Difficulty of Paying Living Expenses:   Food Insecurity:   . Worried About Programme researcher, broadcasting/film/video in the Last Year:   . Barista in the Last Year:   Transportation Needs:   . Freight forwarder (Medical):   Marland Kitchen Lack of Transportation (Non-Medical):   Physical Activity:   . Days of Exercise per Week:   . Minutes of Exercise per Session:   Stress:   . Feeling of Stress :   Social  Connections:   . Frequency of Communication with Friends and Family:   . Frequency of Social Gatherings with Friends and Family:   . Attends Religious Services:   . Active Member of Clubs or Organizations:   . Attends Banker Meetings:   Marland Kitchen Marital Status:   Intimate Partner Violence:   . Fear of Current or Ex-Partner:   . Emotionally Abused:   Marland Kitchen Physically Abused:   . Sexually Abused:     Outpatient Medications Prior to Visit  Medication Sig Dispense Refill  . acetaminophen (TYLENOL) 325 MG tablet Take 650 mg by mouth every 6 (six) hours as needed.    Marland Kitchen albuterol (VENTOLIN HFA) 108 (90 Base) MCG/ACT inhaler Inhale 2 puffs into the lungs every 6 (six) hours as needed for wheezing or shortness of breath. Please instruct in proper MDI technique. 18 g 11  . fluticasone (FLONASE) 50 MCG/ACT nasal spray Place 2 sprays into both nostrils daily. 16 g 6  . furosemide (LASIX) 20 MG tablet Take 1 tablet (20 mg total) by mouth 2 (two) times daily. 60 tablet 3  . naproxen (NAPROSYN) 500 MG tablet Take 1 tablet (500 mg total) by mouth 2 (two) times daily with a meal. 60 tablet 3  . spironolactone (ALDACTONE) 25 MG tablet Take 1 tablet (25 mg total) by mouth 2 (two) times daily. 60 tablet 2  . Vitamin D, Ergocalciferol, (DRISDOL) 1.25 MG (50000 UNIT) CAPS capsule Take 1 capsule (50,000 Units total) by mouth every 7 (seven) days. 5 capsule 6  . diphenhydrAMINE (BENADRYL) 25 MG tablet Take 1 tablet (25 mg total) by mouth every 6 (six) hours as needed. (Patient not taking: Reported on 03/15/2020) 30 tablet 2  . doxycycline (VIBRA-TABS) 100 MG tablet Take 1 tablet (100 mg total) by mouth 2 (two) times daily. (Patient not taking: Reported on 02/11/2020) 20 tablet 0   No facility-administered medications prior to visit.    Allergies  Allergen Reactions  . Guaifenesin & Derivatives   . Paxil [Paroxetine]   . Penicillins   . Sulfa Antibiotics     ROS Review of Systems  Constitutional:  Negative.   HENT: Negative.   Eyes: Negative.   Respiratory: Positive for shortness of breath (occasional).   Cardiovascular: Negative.   Gastrointestinal: Positive for abdominal distention (obese).  Endocrine: Negative.   Genitourinary: Negative.   Musculoskeletal: Positive for arthralgias (generalized joint pain) and back pain (chronic ).  Skin: Negative.   Allergic/Immunologic: Negative.   Neurological: Positive for dizziness (occasional ), weakness (generalized) and headaches (occasional ).  Hematological: Negative.   Psychiatric/Behavioral: Negative.       Objective:    Physical Exam Vitals and nursing note reviewed.  Constitutional:      Appearance: Normal appearance.  HENT:     Head: Normocephalic and atraumatic.     Nose: Nose normal.  Mouth/Throat:     Mouth: Mucous membranes are moist.  Eyes:     Pupils: Pupils are equal, round, and reactive to light.  Cardiovascular:     Rate and Rhythm: Normal rate and regular rhythm.  Pulmonary:     Effort: Pulmonary effort is normal.     Breath sounds: Normal breath sounds.  Abdominal:     General: There is distension (obese).     Palpations: Abdomen is soft.  Musculoskeletal:        General: Normal range of motion.     Cervical back: Normal range of motion and neck supple.  Skin:    General: Skin is warm and dry.  Neurological:     Mental Status: She is alert.  Psychiatric:        Mood and Affect: Mood normal.        Thought Content: Thought content normal.        Judgment: Judgment normal.     Wt 250 lb 6.4 oz (113.6 kg)   BMI 45.80 kg/m  Wt Readings from Last 3 Encounters:  03/15/20 250 lb 6.4 oz (113.6 kg)  12/12/19 252 lb 9.6 oz (114.6 kg)  03/08/18 237 lb 9.6 oz (107.8 kg)     Health Maintenance Due  Topic Date Due  . URINE MICROALBUMIN  Never done  . COVID-19 Vaccine (1) Never done  . TETANUS/TDAP  Never done  . PAP SMEAR-Modifier  Never done  . MAMMOGRAM  Never done  . COLONOSCOPY  Never  done    There are no preventive care reminders to display for this patient.  Lab Results  Component Value Date   TSH 1.950 12/12/2019   Lab Results  Component Value Date   WBC 9.7 12/12/2019   HGB 18.6 (H) 12/12/2019   HCT 56.8 (H) 12/12/2019   MCV 93 12/12/2019   PLT 193 12/12/2019   Lab Results  Component Value Date   NA 141 12/12/2019   K 4.4 12/12/2019   CO2 25 12/12/2019   GLUCOSE 141 (H) 12/12/2019   BUN 5 (L) 12/12/2019   CREATININE 0.56 (L) 12/12/2019   BILITOT 0.4 12/12/2019   ALKPHOS 131 (H) 12/12/2019   AST 28 12/12/2019   ALT 17 12/12/2019   PROT 7.5 12/12/2019   ALBUMIN 4.2 12/12/2019   CALCIUM 9.3 12/12/2019   ANIONGAP 8 01/16/2018   GFR 105.66 03/08/2018   Lab Results  Component Value Date   CHOL 177 12/12/2019   Lab Results  Component Value Date   HDL 39 (L) 12/12/2019   Lab Results  Component Value Date   LDLCALC 108 (H) 12/12/2019   Lab Results  Component Value Date   TRIG 169 (H) 12/12/2019   Lab Results  Component Value Date   CHOLHDL 4.5 (H) 12/12/2019   Lab Results  Component Value Date   HGBA1C 7.4 (A) 12/12/2019      Assessment & Plan:   1. Chronic back pain, unspecified back location, unspecified back pain laterality  2. Muscle spasm of back - cyclobenzaprine (FLEXERIL) 10 MG tablet; Take 1 tablet (10 mg total) by mouth 3 (three) times daily as needed for muscle spasms.  Dispense: 60 tablet; Refill: 3  3. Anxiety - buPROPion (WELLBUTRIN XL) 150 MG 24 hr tablet; Take 1 tablet (150 mg total) by mouth daily.  Dispense: 30 tablet; Refill: 6  4. Encounter for smoking cessation counseling - buPROPion (WELLBUTRIN XL) 150 MG 24 hr tablet; Take 1 tablet (150 mg total) by mouth daily.  Dispense: 30 tablet; Refill: 6  5. Long term current use of diuretic - Potassium  6. Bilateral lower extremity edema 1+ edema.   7. Follow up She will follow up in 6 months.   Meds ordered this encounter  Medications  . cyclobenzaprine  (FLEXERIL) 10 MG tablet    Sig: Take 1 tablet (10 mg total) by mouth 3 (three) times daily as needed for muscle spasms.    Dispense:  60 tablet    Refill:  3  . buPROPion (WELLBUTRIN XL) 150 MG 24 hr tablet    Sig: Take 1 tablet (150 mg total) by mouth daily.    Dispense:  30 tablet    Refill:  6    Orders Placed This Encounter  Procedures  . Potassium    Referral Orders  No referral(s) requested today    Raliegh Ip,  MSN, FNP-BC [facility] [address] [place], [place] [address] [phone] fax    Problem List Items Addressed This Visit    None      No orders of the defined types were placed in this encounter.   Follow-up: No follow-ups on file.    Kallie Locks, FNP

## 2020-03-16 ENCOUNTER — Encounter: Payer: Self-pay | Admitting: Family Medicine

## 2020-03-16 LAB — POTASSIUM: Potassium: 3.9 mmol/L (ref 3.5–5.2)

## 2020-03-17 NOTE — Progress Notes (Signed)
Patient notified of results, verbally understood. No additional questions.

## 2020-04-01 DIAGNOSIS — J449 Chronic obstructive pulmonary disease, unspecified: Secondary | ICD-10-CM | POA: Diagnosis not present

## 2020-04-01 DIAGNOSIS — J9612 Chronic respiratory failure with hypercapnia: Secondary | ICD-10-CM | POA: Diagnosis not present

## 2020-05-02 DIAGNOSIS — J9612 Chronic respiratory failure with hypercapnia: Secondary | ICD-10-CM | POA: Diagnosis not present

## 2020-05-02 DIAGNOSIS — J449 Chronic obstructive pulmonary disease, unspecified: Secondary | ICD-10-CM | POA: Diagnosis not present

## 2020-05-10 DIAGNOSIS — G8929 Other chronic pain: Secondary | ICD-10-CM | POA: Diagnosis not present

## 2020-05-10 DIAGNOSIS — M25552 Pain in left hip: Secondary | ICD-10-CM | POA: Diagnosis not present

## 2020-05-10 DIAGNOSIS — M549 Dorsalgia, unspecified: Secondary | ICD-10-CM | POA: Diagnosis not present

## 2020-06-02 DIAGNOSIS — J449 Chronic obstructive pulmonary disease, unspecified: Secondary | ICD-10-CM | POA: Diagnosis not present

## 2020-06-02 DIAGNOSIS — J9612 Chronic respiratory failure with hypercapnia: Secondary | ICD-10-CM | POA: Diagnosis not present

## 2020-07-02 DIAGNOSIS — J9612 Chronic respiratory failure with hypercapnia: Secondary | ICD-10-CM | POA: Diagnosis not present

## 2020-07-02 DIAGNOSIS — J449 Chronic obstructive pulmonary disease, unspecified: Secondary | ICD-10-CM | POA: Diagnosis not present

## 2020-07-26 ENCOUNTER — Telehealth: Payer: Self-pay | Admitting: Family Medicine

## 2020-07-27 ENCOUNTER — Other Ambulatory Visit: Payer: Self-pay | Admitting: Nurse Practitioner

## 2020-07-27 DIAGNOSIS — R0609 Other forms of dyspnea: Secondary | ICD-10-CM

## 2020-07-27 DIAGNOSIS — R0600 Dyspnea, unspecified: Secondary | ICD-10-CM

## 2020-07-27 MED ORDER — SPIRONOLACTONE 25 MG PO TABS: 25.0000 mg | ORAL_TABLET | Freq: Two times a day (BID) | ORAL | 0 refills | Status: AC

## 2020-07-27 NOTE — Progress Notes (Signed)
   [facility] [address] [place], [place]  [address] Phone:  [phone]   Fax:  [phone]  Spironolactone 25 mg Bid x 30 days refilled.

## 2020-07-27 NOTE — Telephone Encounter (Signed)
I can fill one but I do not feel comfortable filling both. The Spirolactone looks like it is need most since last filled in March. I will send this in. She will have to been seen if she needs additional before NP Bradly Chris returns.  I hope this helps

## 2020-08-02 DIAGNOSIS — J449 Chronic obstructive pulmonary disease, unspecified: Secondary | ICD-10-CM | POA: Diagnosis not present

## 2020-08-02 DIAGNOSIS — J9612 Chronic respiratory failure with hypercapnia: Secondary | ICD-10-CM | POA: Diagnosis not present

## 2020-08-05 ENCOUNTER — Other Ambulatory Visit: Payer: Self-pay | Admitting: Family Medicine

## 2020-08-05 DIAGNOSIS — R600 Localized edema: Secondary | ICD-10-CM

## 2020-09-01 DIAGNOSIS — J9612 Chronic respiratory failure with hypercapnia: Secondary | ICD-10-CM | POA: Diagnosis not present

## 2020-09-01 DIAGNOSIS — J449 Chronic obstructive pulmonary disease, unspecified: Secondary | ICD-10-CM | POA: Diagnosis not present

## 2020-09-14 ENCOUNTER — Ambulatory Visit: Payer: Medicaid Other | Admitting: Family Medicine

## 2020-11-02 DIAGNOSIS — J449 Chronic obstructive pulmonary disease, unspecified: Secondary | ICD-10-CM | POA: Diagnosis not present

## 2020-11-02 DIAGNOSIS — J9612 Chronic respiratory failure with hypercapnia: Secondary | ICD-10-CM | POA: Diagnosis not present

## 2020-11-30 DIAGNOSIS — J449 Chronic obstructive pulmonary disease, unspecified: Secondary | ICD-10-CM | POA: Diagnosis not present

## 2020-11-30 DIAGNOSIS — J9612 Chronic respiratory failure with hypercapnia: Secondary | ICD-10-CM | POA: Diagnosis not present

## 2020-12-31 DIAGNOSIS — J9612 Chronic respiratory failure with hypercapnia: Secondary | ICD-10-CM | POA: Diagnosis not present

## 2020-12-31 DIAGNOSIS — J449 Chronic obstructive pulmonary disease, unspecified: Secondary | ICD-10-CM | POA: Diagnosis not present

## 2021-01-30 DIAGNOSIS — J449 Chronic obstructive pulmonary disease, unspecified: Secondary | ICD-10-CM | POA: Diagnosis not present

## 2021-01-30 DIAGNOSIS — J9612 Chronic respiratory failure with hypercapnia: Secondary | ICD-10-CM | POA: Diagnosis not present

## 2021-03-02 DIAGNOSIS — J449 Chronic obstructive pulmonary disease, unspecified: Secondary | ICD-10-CM | POA: Diagnosis not present

## 2021-03-02 DIAGNOSIS — J9612 Chronic respiratory failure with hypercapnia: Secondary | ICD-10-CM | POA: Diagnosis not present

## 2021-04-01 DIAGNOSIS — J9612 Chronic respiratory failure with hypercapnia: Secondary | ICD-10-CM | POA: Diagnosis not present

## 2021-04-01 DIAGNOSIS — J449 Chronic obstructive pulmonary disease, unspecified: Secondary | ICD-10-CM | POA: Diagnosis not present

## 2021-05-02 DIAGNOSIS — J9612 Chronic respiratory failure with hypercapnia: Secondary | ICD-10-CM | POA: Diagnosis not present

## 2021-05-02 DIAGNOSIS — J449 Chronic obstructive pulmonary disease, unspecified: Secondary | ICD-10-CM | POA: Diagnosis not present

## 2021-06-02 DIAGNOSIS — J9612 Chronic respiratory failure with hypercapnia: Secondary | ICD-10-CM | POA: Diagnosis not present

## 2021-06-02 DIAGNOSIS — J449 Chronic obstructive pulmonary disease, unspecified: Secondary | ICD-10-CM | POA: Diagnosis not present

## 2021-07-02 DIAGNOSIS — J449 Chronic obstructive pulmonary disease, unspecified: Secondary | ICD-10-CM | POA: Diagnosis not present

## 2021-07-02 DIAGNOSIS — J9612 Chronic respiratory failure with hypercapnia: Secondary | ICD-10-CM | POA: Diagnosis not present

## 2021-08-02 DIAGNOSIS — J9612 Chronic respiratory failure with hypercapnia: Secondary | ICD-10-CM | POA: Diagnosis not present

## 2021-08-02 DIAGNOSIS — J449 Chronic obstructive pulmonary disease, unspecified: Secondary | ICD-10-CM | POA: Diagnosis not present

## 2021-09-01 DIAGNOSIS — J449 Chronic obstructive pulmonary disease, unspecified: Secondary | ICD-10-CM | POA: Diagnosis not present

## 2021-09-01 DIAGNOSIS — J9612 Chronic respiratory failure with hypercapnia: Secondary | ICD-10-CM | POA: Diagnosis not present

## 2021-10-02 DIAGNOSIS — J449 Chronic obstructive pulmonary disease, unspecified: Secondary | ICD-10-CM | POA: Diagnosis not present

## 2021-10-02 DIAGNOSIS — J9612 Chronic respiratory failure with hypercapnia: Secondary | ICD-10-CM | POA: Diagnosis not present

## 2021-11-02 DIAGNOSIS — J449 Chronic obstructive pulmonary disease, unspecified: Secondary | ICD-10-CM | POA: Diagnosis not present

## 2021-11-02 DIAGNOSIS — J9612 Chronic respiratory failure with hypercapnia: Secondary | ICD-10-CM | POA: Diagnosis not present

## 2021-11-30 DIAGNOSIS — J9612 Chronic respiratory failure with hypercapnia: Secondary | ICD-10-CM | POA: Diagnosis not present

## 2021-11-30 DIAGNOSIS — J449 Chronic obstructive pulmonary disease, unspecified: Secondary | ICD-10-CM | POA: Diagnosis not present

## 2021-12-31 DIAGNOSIS — J9612 Chronic respiratory failure with hypercapnia: Secondary | ICD-10-CM | POA: Diagnosis not present

## 2021-12-31 DIAGNOSIS — J449 Chronic obstructive pulmonary disease, unspecified: Secondary | ICD-10-CM | POA: Diagnosis not present

## 2022-01-30 DIAGNOSIS — J449 Chronic obstructive pulmonary disease, unspecified: Secondary | ICD-10-CM | POA: Diagnosis not present

## 2022-01-30 DIAGNOSIS — J9612 Chronic respiratory failure with hypercapnia: Secondary | ICD-10-CM | POA: Diagnosis not present

## 2022-03-02 DIAGNOSIS — J9612 Chronic respiratory failure with hypercapnia: Secondary | ICD-10-CM | POA: Diagnosis not present

## 2022-03-02 DIAGNOSIS — J449 Chronic obstructive pulmonary disease, unspecified: Secondary | ICD-10-CM | POA: Diagnosis not present

## 2022-04-01 DIAGNOSIS — J9612 Chronic respiratory failure with hypercapnia: Secondary | ICD-10-CM | POA: Diagnosis not present

## 2022-04-01 DIAGNOSIS — J449 Chronic obstructive pulmonary disease, unspecified: Secondary | ICD-10-CM | POA: Diagnosis not present

## 2022-05-02 DIAGNOSIS — J9612 Chronic respiratory failure with hypercapnia: Secondary | ICD-10-CM | POA: Diagnosis not present

## 2022-05-02 DIAGNOSIS — J449 Chronic obstructive pulmonary disease, unspecified: Secondary | ICD-10-CM | POA: Diagnosis not present

## 2022-06-02 DIAGNOSIS — J9612 Chronic respiratory failure with hypercapnia: Secondary | ICD-10-CM | POA: Diagnosis not present

## 2022-06-02 DIAGNOSIS — J449 Chronic obstructive pulmonary disease, unspecified: Secondary | ICD-10-CM | POA: Diagnosis not present

## 2022-07-02 DIAGNOSIS — J449 Chronic obstructive pulmonary disease, unspecified: Secondary | ICD-10-CM | POA: Diagnosis not present

## 2022-07-02 DIAGNOSIS — J9612 Chronic respiratory failure with hypercapnia: Secondary | ICD-10-CM | POA: Diagnosis not present

## 2022-08-02 DIAGNOSIS — J449 Chronic obstructive pulmonary disease, unspecified: Secondary | ICD-10-CM | POA: Diagnosis not present

## 2022-08-02 DIAGNOSIS — J9612 Chronic respiratory failure with hypercapnia: Secondary | ICD-10-CM | POA: Diagnosis not present

## 2022-09-01 DIAGNOSIS — J9612 Chronic respiratory failure with hypercapnia: Secondary | ICD-10-CM | POA: Diagnosis not present

## 2022-09-01 DIAGNOSIS — J449 Chronic obstructive pulmonary disease, unspecified: Secondary | ICD-10-CM | POA: Diagnosis not present

## 2022-10-02 DIAGNOSIS — J449 Chronic obstructive pulmonary disease, unspecified: Secondary | ICD-10-CM | POA: Diagnosis not present

## 2022-10-02 DIAGNOSIS — J9612 Chronic respiratory failure with hypercapnia: Secondary | ICD-10-CM | POA: Diagnosis not present

## 2022-11-02 DIAGNOSIS — J9612 Chronic respiratory failure with hypercapnia: Secondary | ICD-10-CM | POA: Diagnosis not present

## 2022-11-02 DIAGNOSIS — J449 Chronic obstructive pulmonary disease, unspecified: Secondary | ICD-10-CM | POA: Diagnosis not present

## 2022-12-01 DIAGNOSIS — J449 Chronic obstructive pulmonary disease, unspecified: Secondary | ICD-10-CM | POA: Diagnosis not present

## 2022-12-01 DIAGNOSIS — J9612 Chronic respiratory failure with hypercapnia: Secondary | ICD-10-CM | POA: Diagnosis not present

## 2023-01-01 DIAGNOSIS — J9612 Chronic respiratory failure with hypercapnia: Secondary | ICD-10-CM | POA: Diagnosis not present

## 2023-01-01 DIAGNOSIS — J449 Chronic obstructive pulmonary disease, unspecified: Secondary | ICD-10-CM | POA: Diagnosis not present

## 2023-01-31 DIAGNOSIS — J9612 Chronic respiratory failure with hypercapnia: Secondary | ICD-10-CM | POA: Diagnosis not present

## 2023-01-31 DIAGNOSIS — J449 Chronic obstructive pulmonary disease, unspecified: Secondary | ICD-10-CM | POA: Diagnosis not present

## 2023-03-03 DIAGNOSIS — J449 Chronic obstructive pulmonary disease, unspecified: Secondary | ICD-10-CM | POA: Diagnosis not present

## 2023-03-03 DIAGNOSIS — J9612 Chronic respiratory failure with hypercapnia: Secondary | ICD-10-CM | POA: Diagnosis not present

## 2023-04-02 DIAGNOSIS — J9612 Chronic respiratory failure with hypercapnia: Secondary | ICD-10-CM | POA: Diagnosis not present

## 2023-04-02 DIAGNOSIS — J449 Chronic obstructive pulmonary disease, unspecified: Secondary | ICD-10-CM | POA: Diagnosis not present

## 2023-05-03 DIAGNOSIS — J449 Chronic obstructive pulmonary disease, unspecified: Secondary | ICD-10-CM | POA: Diagnosis not present

## 2023-05-03 DIAGNOSIS — J9612 Chronic respiratory failure with hypercapnia: Secondary | ICD-10-CM | POA: Diagnosis not present

## 2023-06-03 DIAGNOSIS — J449 Chronic obstructive pulmonary disease, unspecified: Secondary | ICD-10-CM | POA: Diagnosis not present

## 2023-06-03 DIAGNOSIS — J9612 Chronic respiratory failure with hypercapnia: Secondary | ICD-10-CM | POA: Diagnosis not present

## 2023-07-03 DIAGNOSIS — J9612 Chronic respiratory failure with hypercapnia: Secondary | ICD-10-CM | POA: Diagnosis not present

## 2023-07-03 DIAGNOSIS — J449 Chronic obstructive pulmonary disease, unspecified: Secondary | ICD-10-CM | POA: Diagnosis not present

## 2023-08-03 DIAGNOSIS — J449 Chronic obstructive pulmonary disease, unspecified: Secondary | ICD-10-CM | POA: Diagnosis not present

## 2023-08-03 DIAGNOSIS — J9612 Chronic respiratory failure with hypercapnia: Secondary | ICD-10-CM | POA: Diagnosis not present

## 2023-09-02 DIAGNOSIS — J449 Chronic obstructive pulmonary disease, unspecified: Secondary | ICD-10-CM | POA: Diagnosis not present

## 2023-09-02 DIAGNOSIS — J9612 Chronic respiratory failure with hypercapnia: Secondary | ICD-10-CM | POA: Diagnosis not present

## 2023-10-03 DIAGNOSIS — J449 Chronic obstructive pulmonary disease, unspecified: Secondary | ICD-10-CM | POA: Diagnosis not present

## 2023-10-03 DIAGNOSIS — J9612 Chronic respiratory failure with hypercapnia: Secondary | ICD-10-CM | POA: Diagnosis not present

## 2023-11-03 DIAGNOSIS — J449 Chronic obstructive pulmonary disease, unspecified: Secondary | ICD-10-CM | POA: Diagnosis not present

## 2023-11-03 DIAGNOSIS — J9612 Chronic respiratory failure with hypercapnia: Secondary | ICD-10-CM | POA: Diagnosis not present

## 2023-12-01 DIAGNOSIS — J449 Chronic obstructive pulmonary disease, unspecified: Secondary | ICD-10-CM | POA: Diagnosis not present

## 2023-12-01 DIAGNOSIS — J9612 Chronic respiratory failure with hypercapnia: Secondary | ICD-10-CM | POA: Diagnosis not present

## 2024-01-01 DIAGNOSIS — J449 Chronic obstructive pulmonary disease, unspecified: Secondary | ICD-10-CM | POA: Diagnosis not present

## 2024-01-01 DIAGNOSIS — J9612 Chronic respiratory failure with hypercapnia: Secondary | ICD-10-CM | POA: Diagnosis not present

## 2024-01-31 DIAGNOSIS — J9612 Chronic respiratory failure with hypercapnia: Secondary | ICD-10-CM | POA: Diagnosis not present

## 2024-01-31 DIAGNOSIS — J449 Chronic obstructive pulmonary disease, unspecified: Secondary | ICD-10-CM | POA: Diagnosis not present

## 2024-03-02 DIAGNOSIS — J9612 Chronic respiratory failure with hypercapnia: Secondary | ICD-10-CM | POA: Diagnosis not present

## 2024-03-02 DIAGNOSIS — J449 Chronic obstructive pulmonary disease, unspecified: Secondary | ICD-10-CM | POA: Diagnosis not present

## 2024-04-01 DIAGNOSIS — J449 Chronic obstructive pulmonary disease, unspecified: Secondary | ICD-10-CM | POA: Diagnosis not present

## 2024-04-01 DIAGNOSIS — J9612 Chronic respiratory failure with hypercapnia: Secondary | ICD-10-CM | POA: Diagnosis not present

## 2024-05-02 DIAGNOSIS — J9612 Chronic respiratory failure with hypercapnia: Secondary | ICD-10-CM | POA: Diagnosis not present

## 2024-05-02 DIAGNOSIS — J449 Chronic obstructive pulmonary disease, unspecified: Secondary | ICD-10-CM | POA: Diagnosis not present

## 2024-06-02 DIAGNOSIS — J9612 Chronic respiratory failure with hypercapnia: Secondary | ICD-10-CM | POA: Diagnosis not present

## 2024-07-02 DIAGNOSIS — J449 Chronic obstructive pulmonary disease, unspecified: Secondary | ICD-10-CM | POA: Diagnosis not present

## 2024-07-02 DIAGNOSIS — J9612 Chronic respiratory failure with hypercapnia: Secondary | ICD-10-CM | POA: Diagnosis not present

## 2024-08-02 DIAGNOSIS — J9612 Chronic respiratory failure with hypercapnia: Secondary | ICD-10-CM | POA: Diagnosis not present
